# Patient Record
Sex: Female | Born: 1990 | Race: Black or African American | Hispanic: No | Smoking: Never smoker
Health system: Southern US, Community
[De-identification: ages and names within clinical notes are randomized; demographics above are authoritative.]

## PROBLEM LIST (undated history)

## (undated) ENCOUNTER — Ambulatory Visit: Admission: EM | Payer: 59 | Source: Home / Self Care

## (undated) DIAGNOSIS — L732 Hidradenitis suppurativa: Secondary | ICD-10-CM

## (undated) DIAGNOSIS — Z862 Personal history of diseases of the blood and blood-forming organs and certain disorders involving the immune mechanism: Secondary | ICD-10-CM

## (undated) HISTORY — DX: Hidradenitis suppurativa: L73.2

## (undated) HISTORY — DX: Personal history of diseases of the blood and blood-forming organs and certain disorders involving the immune mechanism: Z86.2

## (undated) HISTORY — PX: OTHER SURGICAL HISTORY: SHX169

## (undated) HISTORY — PX: TONSILLECTOMY: SUR1361

---
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
## 2008-06-12 ENCOUNTER — Emergency Department: Payer: Self-pay | Admitting: Emergency Medicine

## 2008-06-14 ENCOUNTER — Emergency Department: Payer: Self-pay | Admitting: Emergency Medicine

## 2008-07-31 ENCOUNTER — Emergency Department: Payer: Self-pay | Admitting: Emergency Medicine

## 2008-08-03 ENCOUNTER — Emergency Department: Payer: Self-pay | Admitting: Emergency Medicine

## 2008-11-11 ENCOUNTER — Emergency Department: Payer: Self-pay | Admitting: Internal Medicine

## 2009-06-17 ENCOUNTER — Inpatient Hospital Stay: Payer: Self-pay | Admitting: Obstetrics & Gynecology

## 2009-07-03 ENCOUNTER — Emergency Department: Payer: Self-pay | Admitting: Emergency Medicine

## 2009-07-27 ENCOUNTER — Emergency Department: Payer: Self-pay | Admitting: Emergency Medicine

## 2010-01-01 ENCOUNTER — Emergency Department: Payer: Self-pay | Admitting: Emergency Medicine

## 2010-03-26 ENCOUNTER — Emergency Department: Payer: Self-pay | Admitting: Emergency Medicine

## 2010-05-08 ENCOUNTER — Emergency Department: Payer: Self-pay | Admitting: Emergency Medicine

## 2010-06-02 ENCOUNTER — Emergency Department: Payer: Self-pay | Admitting: Unknown Physician Specialty

## 2010-10-03 ENCOUNTER — Emergency Department: Payer: Self-pay | Admitting: Emergency Medicine

## 2010-10-04 ENCOUNTER — Inpatient Hospital Stay: Payer: Self-pay | Admitting: Advanced Practice Midwife

## 2011-04-10 ENCOUNTER — Emergency Department: Payer: Self-pay | Admitting: Emergency Medicine

## 2011-04-12 ENCOUNTER — Emergency Department: Payer: Self-pay | Admitting: Emergency Medicine

## 2011-04-20 ENCOUNTER — Emergency Department: Payer: Self-pay | Admitting: Emergency Medicine

## 2011-12-07 ENCOUNTER — Emergency Department: Payer: Self-pay | Admitting: Emergency Medicine

## 2012-01-14 ENCOUNTER — Emergency Department: Payer: Self-pay | Admitting: *Deleted

## 2012-03-01 ENCOUNTER — Ambulatory Visit: Payer: Self-pay | Admitting: Internal Medicine

## 2012-03-19 ENCOUNTER — Emergency Department: Payer: Self-pay | Admitting: Emergency Medicine

## 2012-03-19 LAB — URINALYSIS, COMPLETE
Glucose,UR: NEGATIVE mg/dL (ref 0–75)
Nitrite: NEGATIVE
Ph: 6 (ref 4.5–8.0)
Specific Gravity: 1.029 (ref 1.003–1.030)

## 2012-03-19 LAB — COMPREHENSIVE METABOLIC PANEL
Alkaline Phosphatase: 67 U/L (ref 50–136)
BUN: 13 mg/dL (ref 7–18)
Bilirubin,Total: 0.2 mg/dL (ref 0.2–1.0)
Chloride: 105 mmol/L (ref 98–107)
Co2: 26 mmol/L (ref 21–32)
Creatinine: 0.74 mg/dL (ref 0.60–1.30)
EGFR (African American): >60
EGFR (Non-African Amer.): >60
Osmolality: 275 (ref 275–301)
Sodium: 138 mmol/L (ref 136–145)

## 2012-03-19 LAB — CSF CELL CT + PROT + GLU PANEL
CSF Tube #: 1
Glucose, CSF: 47 mg/dL (ref 40–75)
Monocytes/Macrophages: 12 %
Protein, CSF: 25 mg/dL (ref 15–45)
RBC (CSF): 30 /mm3

## 2012-03-19 LAB — CSF CELL COUNT WITH DIFFERENTIAL
Lymphocytes: 93 %
Monocytes/Macrophages: 7 %

## 2012-03-19 LAB — CBC WITH DIFFERENTIAL/PLATELET
Basophil %: 0.5 %
Eosinophil #: 0.2 10*3/uL (ref 0.0–0.7)
Eosinophil %: 3.9 %
HGB: 10.7 g/dL — ABNORMAL LOW (ref 12.0–16.0)
Lymphocyte %: 48.7 %
MCHC: 34.6 g/dL (ref 32.0–36.0)
Monocyte #: 0.4 x10 3/mm (ref 0.2–0.9)
Neutrophil %: 37 %
Platelet: 242 10*3/uL (ref 150–440)
RBC: 3.44 10*6/uL — ABNORMAL LOW (ref 3.80–5.20)

## 2012-03-22 LAB — CSF CULTURE W GRAM STAIN

## 2012-03-28 ENCOUNTER — Emergency Department: Payer: Self-pay | Admitting: Emergency Medicine

## 2012-06-18 ENCOUNTER — Emergency Department: Payer: Self-pay | Admitting: Emergency Medicine

## 2013-06-22 ENCOUNTER — Emergency Department: Payer: Self-pay | Admitting: Emergency Medicine

## 2013-08-04 ENCOUNTER — Emergency Department: Payer: Self-pay | Admitting: Emergency Medicine

## 2013-08-04 LAB — CBC WITH DIFFERENTIAL/PLATELET
BASOS ABS: 0 10*3/uL (ref 0.0–0.1)
Basophil %: 0.4 %
Eosinophil #: 0 10*3/uL (ref 0.0–0.7)
Eosinophil %: 0.8 %
HCT: 32 % — ABNORMAL LOW (ref 35.0–47.0)
HGB: 11.3 g/dL — ABNORMAL LOW (ref 12.0–16.0)
LYMPHS ABS: 0.7 10*3/uL — AB (ref 1.0–3.6)
Lymphocyte %: 12.3 %
MCH: 33.4 pg (ref 26.0–34.0)
MCHC: 35.2 g/dL (ref 32.0–36.0)
MCV: 95 fL (ref 80–100)
MONOS PCT: 8.2 %
Monocyte #: 0.5 x10 3/mm (ref 0.2–0.9)
Neutrophil #: 4.3 10*3/uL (ref 1.4–6.5)
Neutrophil %: 78.3 %
Platelet: 222 10*3/uL (ref 150–440)
RBC: 3.37 10*6/uL — ABNORMAL LOW (ref 3.80–5.20)
RDW: 14 % (ref 11.5–14.5)
WBC: 5.5 10*3/uL (ref 3.6–11.0)

## 2013-08-04 LAB — COMPREHENSIVE METABOLIC PANEL
ALK PHOS: 61 U/L
ANION GAP: 6 — AB (ref 7–16)
Albumin: 3.5 g/dL (ref 3.4–5.0)
BUN: 12 mg/dL (ref 7–18)
Bilirubin,Total: 0.3 mg/dL (ref 0.2–1.0)
CALCIUM: 8.9 mg/dL (ref 8.5–10.1)
CREATININE: 0.78 mg/dL (ref 0.60–1.30)
Chloride: 104 mmol/L (ref 98–107)
Co2: 23 mmol/L (ref 21–32)
EGFR (African American): >60
Glucose: 98 mg/dL (ref 65–99)
OSMOLALITY: 266 (ref 275–301)
Potassium: 3.7 mmol/L (ref 3.5–5.1)
SGOT(AST): 24 U/L (ref 15–37)
SGPT (ALT): 19 U/L (ref 12–78)
SODIUM: 133 mmol/L — AB (ref 136–145)
TOTAL PROTEIN: 8 g/dL (ref 6.4–8.2)

## 2013-08-04 LAB — RAPID INFLUENZA A&B ANTIGENS

## 2013-08-05 LAB — URINALYSIS, COMPLETE
Bilirubin,UR: NEGATIVE
Blood: NEGATIVE
GLUCOSE, UR: NEGATIVE mg/dL (ref 0–75)
Ketone: NEGATIVE
Nitrite: NEGATIVE
PROTEIN: NEGATIVE
Ph: 7 (ref 4.5–8.0)
RBC,UR: 2 /HPF (ref 0–5)
Specific Gravity: 1.016 (ref 1.003–1.030)
WBC UR: 10 /HPF (ref 0–5)

## 2013-08-06 LAB — URINE CULTURE

## 2013-08-09 LAB — CULTURE, BLOOD (SINGLE)

## 2014-05-15 ENCOUNTER — Emergency Department: Payer: Self-pay | Admitting: Emergency Medicine

## 2014-05-15 LAB — CBC
HCT: 33.3 % — ABNORMAL LOW (ref 35.0–47.0)
HGB: 10.9 g/dL — AB (ref 12.0–16.0)
MCH: 30.7 pg (ref 26.0–34.0)
MCHC: 32.8 g/dL (ref 32.0–36.0)
MCV: 93 fL (ref 80–100)
Platelet: 257 10*3/uL (ref 150–440)
RBC: 3.56 10*6/uL — ABNORMAL LOW (ref 3.80–5.20)
RDW: 13.6 % (ref 11.5–14.5)
WBC: 4.3 10*3/uL (ref 3.6–11.0)

## 2014-06-02 ENCOUNTER — Emergency Department: Payer: Self-pay | Admitting: Emergency Medicine

## 2014-06-07 ENCOUNTER — Emergency Department: Payer: Self-pay | Admitting: Emergency Medicine

## 2014-06-29 ENCOUNTER — Emergency Department: Payer: Self-pay | Admitting: Emergency Medicine

## 2014-07-03 LAB — WOUND CULTURE

## 2014-07-05 ENCOUNTER — Emergency Department: Payer: Self-pay | Admitting: Student

## 2014-08-25 ENCOUNTER — Emergency Department: Payer: Self-pay | Admitting: Emergency Medicine

## 2015-05-04 ENCOUNTER — Emergency Department
Admission: EM | Admit: 2015-05-04 | Discharge: 2015-05-04 | Disposition: A | Discharge status: Home / Self Care | Payer: Self-pay | Attending: Emergency Medicine | Admitting: Emergency Medicine

## 2015-05-04 DIAGNOSIS — X58XXXA Exposure to other specified factors, initial encounter: Secondary | ICD-10-CM | POA: Insufficient documentation

## 2015-05-04 DIAGNOSIS — Y998 Other external cause status: Secondary | ICD-10-CM | POA: Insufficient documentation

## 2015-05-04 DIAGNOSIS — S46912A Strain of unspecified muscle, fascia and tendon at shoulder and upper arm level, left arm, initial encounter: Secondary | ICD-10-CM | POA: Insufficient documentation

## 2015-05-04 DIAGNOSIS — Y93F2 Activity, caregiving, lifting: Secondary | ICD-10-CM | POA: Insufficient documentation

## 2015-05-04 DIAGNOSIS — Y9289 Other specified places as the place of occurrence of the external cause: Secondary | ICD-10-CM | POA: Insufficient documentation

## 2015-05-04 MED ORDER — DIAZEPAM 2 MG PO TABS: 2.0000 mg | ORAL_TABLET | Freq: Three times a day (TID) | ORAL | Status: DC | PRN

## 2015-05-04 MED ORDER — ETODOLAC 400 MG PO TABS: 400.0000 mg | ORAL_TABLET | Freq: Two times a day (BID) | ORAL | Status: DC

## 2015-05-04 NOTE — ED Notes (Signed)
States she developed pain to mid back yesterday  Denies any nausea /vomiting fever or injury. Pain increases with palpation and when taking a deep breath

## 2015-05-04 NOTE — Discharge Instructions (Signed)
Muscle Strain °A muscle strain (pulled muscle) happens when a muscle is stretched beyond normal length. It happens when a sudden, violent force stretches your muscle too far. Usually, a few of the fibers in your muscle are torn. Muscle strain is common in athletes. Recovery usually takes 1-2 weeks. Complete healing takes 5-6 weeks.  °HOME CARE  °· Follow the PRICE method of treatment to help your injury get better. Do this the first 2-3 days after the injury: °· Protect. Protect the muscle to keep it from getting injured again. °· Rest. Limit your activity and rest the injured body part. °· Ice. Put ice in a plastic bag. Place a towel between your skin and the bag. Then, apply the ice and leave it on from 15-20 minutes each hour. After the third day, switch to moist heat packs. °· Compression. Use a splint or elastic bandage on the injured area for comfort. Do not put it on too tightly. °· Elevate. Keep the injured body part above the level of your heart. °· Only take medicine as told by your doctor. °· Warm up before doing exercise to prevent future muscle strains. °GET HELP IF:  °· You have more pain or puffiness (swelling) in the injured area. °· You feel numbness, tingling, or notice a loss of strength in the injured area. °MAKE SURE YOU:  °· Understand these instructions. °· Will watch your condition. °· Will get help right away if you are not doing well or get worse. °  °This information is not intended to replace advice given to you by your health care provider. Make sure you discuss any questions you have with your health care provider. °  °Document Released: 04/15/2008 Document Revised: 04/27/2013 Document Reviewed: 02/03/2013 °Elsevier Interactive Patient Education ©2016 [employer] ° °Cryotherapy °Cryotherapy is when you put ice on your injury. Ice helps lessen pain and puffiness (swelling) after an injury. Ice works the best when you start using it in the first 24 to 48 hours after an injury. °HOME  CARE °· Put a dry or damp towel between the ice pack and your skin. °· You may press gently on the ice pack. °· Leave the ice on for no more than 10 to 20 minutes at a time. °· Check your skin after 5 minutes to make sure your skin is okay. °· Rest at least 20 minutes between ice pack uses. °· Stop using ice when your skin loses feeling (numbness). °· Do not use ice on someone who cannot tell you when it hurts. This includes small children and people with memory problems (dementia). °GET HELP RIGHT AWAY IF: °· You have white spots on your skin. °· Your skin turns blue or pale. °· Your skin feels waxy or hard. °· Your puffiness gets worse. °MAKE SURE YOU:  °· Understand these instructions. °· Will watch your condition. °· Will get help right away if you are not doing well or get worse. °  °This information is not intended to replace advice given to you by your health care provider. Make sure you discuss any questions you have with your health care provider. °  °Document Released: 12/24/2007 Document Revised: 09/29/2011 Document Reviewed: 02/27/2011 °Elsevier Interactive Patient Education ©2016 Elsevier Inc. ° °

## 2015-05-04 NOTE — ED Provider Notes (Signed)
[facility] Emergency Department Provider Note  ____________________________________________  Time seen: Approximately 9:13 AM  I have reviewed the triage vital signs and the nursing notes.   HISTORY  Chief Complaint Back Pain   HPI Kristina StarringCallie Michael Welch is a 24 y.o. female is here with complaint of back pain since yesterday. Patient states that she has been lifting her children but not doing anything that she recalls having an injury to her back. Range of motion reproduces pain. It is slightly decreased with rest.Patient denies any chest pain, diaphoresis or nausea. She has not taken any over-the-counter medication for her pain. Currently her pain is rated an 8 out of 10.   History reviewed. No pertinent past medical history.  There are no active problems to display for this patient.   Past Surgical History  Procedure Laterality Date  . Tonsillectomy    . Cesarean section    . Cyst removed      Current Outpatient Rx  Name  Route  Sig  Dispense  Refill  . diazepam (VALIUM) 2 MG tablet   Oral   Take 1 tablet (2 mg total) by mouth every 8 (eight) hours as needed for muscle spasms.   9 tablet   0   . etodolac (LODINE) 400 MG tablet   Oral   Take 1 tablet (400 mg total) by mouth 2 (two) times daily.   20 tablet   0     Allergies Peanut-containing drug products and Zyrtec  No family history on file.  Social History Social History  Substance Use Topics  . Smoking status: Never Smoker   . Smokeless tobacco: None  . Alcohol Use: No    Review of Systems Constitutional: No fever/chills Eyes: No visual changes. ENT: No sore throat. Cardiovascular: Denies chest pain. Respiratory: Denies shortness of breath. Gastrointestinal: No abdominal pain.  No nausea, no vomiting.  Genitourinary: Negative for dysuria. Musculoskeletal: Positive for back pain. Skin: Negative for rash. Neurological: Negative for headaches, focal weakness or  numbness.  10-point ROS otherwise negative.  ____________________________________________   PHYSICAL EXAM:  VITAL SIGNS: ED Triage Vitals  Enc Vitals Group     BP 05/04/15 0900 115/61 mmHg     Pulse Rate 05/04/15 0857 65     Resp 05/04/15 0857 17     Temp 05/04/15 0857 97.7 F (36.5 C)     Temp Source 05/04/15 0857 Oral     SpO2 05/04/15 0857 100 %     Weight 05/04/15 0857 200 lb (90.719 kg)     Height 05/04/15 0857 5\' 3"  (1.6 m)     Head Cir --      Peak Flow --      Pain Score 05/04/15 0858 8     Pain Loc --      Pain Edu? --      Excl. in GC? --     Constitutional: Alert and oriented. Well appearing and in no acute distress. Eyes: Conjunctivae are normal. PERRL. EOMI. Head: Atraumatic. Nose: No congestion/rhinnorhea. Neck: No stridor.  No cervical tenderness on palpation area and range of motion of the neck all planes without any restrictions. Cardiovascular: Normal rate, regular rhythm. Grossly normal heart sounds.  Good peripheral circulation. Respiratory: Normal respiratory effort.  No retractions. Lungs CTAB. Gastrointestinal: Soft and nontender. No distention. No CVA tenderness bilaterally Musculoskeletal: Examination of the upper back. She any gross abnormality. Range of motion is minimally restricted. There is no active muscle spasm seen. There is tenderness on  palpation of the parascapular muscles left side and pain is reproduced with exertion. No lower extremity tenderness nor edema.  No joint effusions. Neurologic:  Normal speech and language. No gross focal neurologic deficits are appreciated. No gait instability. Skin:  Skin is warm, dry and intact. No rash noted. Psychiatric: Mood and affect are normal. Speech and behavior are normal.  ____________________________________________   LABS (all labs ordered are listed, but only abnormal results are displayed)  Labs Reviewed - No data to display  PROCEDURES  Procedure(s) performed: None  Critical Care  performed: No  ____________________________________________   INITIAL IMPRESSION / ASSESSMENT AND PLAN / ED COURSE  Pertinent labs & imaging results that were available during my care of the patient were reviewed by me and considered in my medical decision making (see chart for details).  Patient was started on etodolac one twice a day with food. She is also given a prescription for diazepam 2 mg 1 3 times a day for muscle spasms. She is encouraged to use moist heat or ice to her back as needed. She is also follow-up if not improving in one week. ____________________________________________   FINAL CLINICAL IMPRESSION(S) / ED DIAGNOSES  Final diagnoses:  Muscle strain of left scapular region, initial encounter      Tommi Rumps, PA-C 05/04/15 1125  Sharyn Creamer, MD 05/04/15 1504

## 2015-05-04 NOTE — ED Notes (Signed)
Pt c/o upper back pain since yesterday, worse with movement.Marland Kitchen.denies injury

## 2015-05-06 ENCOUNTER — Emergency Department (HOSPITAL_COMMUNITY): Payer: Self-pay

## 2015-05-06 ENCOUNTER — Encounter (HOSPITAL_COMMUNITY): Payer: Self-pay | Admitting: Emergency Medicine

## 2015-05-06 ENCOUNTER — Emergency Department (HOSPITAL_COMMUNITY)
Admission: EM | Admit: 2015-05-06 | Discharge: 2015-05-06 | Disposition: A | Discharge status: Home / Self Care | Payer: Self-pay | Attending: Emergency Medicine | Admitting: Emergency Medicine

## 2015-05-06 DIAGNOSIS — J392 Other diseases of pharynx: Secondary | ICD-10-CM | POA: Insufficient documentation

## 2015-05-06 DIAGNOSIS — M546 Pain in thoracic spine: Secondary | ICD-10-CM | POA: Insufficient documentation

## 2015-05-06 DIAGNOSIS — R509 Fever, unspecified: Secondary | ICD-10-CM | POA: Insufficient documentation

## 2015-05-06 DIAGNOSIS — R059 Cough, unspecified: Secondary | ICD-10-CM

## 2015-05-06 DIAGNOSIS — Z791 Long term (current) use of non-steroidal anti-inflammatories (NSAID): Secondary | ICD-10-CM | POA: Insufficient documentation

## 2015-05-06 DIAGNOSIS — R05 Cough: Secondary | ICD-10-CM | POA: Insufficient documentation

## 2015-05-06 DIAGNOSIS — R0981 Nasal congestion: Secondary | ICD-10-CM | POA: Insufficient documentation

## 2015-05-06 MED ORDER — KETOROLAC TROMETHAMINE 60 MG/2ML IM SOLN: 60.0000 mg | Freq: Once | INTRAMUSCULAR | Status: DC

## 2015-05-06 MED ORDER — DIAZEPAM 5 MG PO TABS
5.0000 mg | ORAL_TABLET | Freq: Once | ORAL | Status: AC
  Administered 2015-05-06: 5 mg via ORAL
  Filled 2015-05-06: qty 1

## 2015-05-06 MED ORDER — IBUPROFEN 800 MG PO TABS
800.0000 mg | ORAL_TABLET | Freq: Once | ORAL | Status: AC
  Administered 2015-05-06: 800 mg via ORAL
  Filled 2015-05-06: qty 1

## 2015-05-06 MED ORDER — AZITHROMYCIN 250 MG PO TABS: ORAL_TABLET | ORAL | Status: DC

## 2015-05-06 NOTE — ED Notes (Signed)
Pt reports on 14th she woke up w/ "bad back pain".  She has developed a cough which is producing green flem, and her chest is now hurting her.  Pt reports no fever/n/v/d.

## 2015-05-06 NOTE — ED Provider Notes (Signed)
CSN: [id number]     Arrival date & time 05/06/15  0304 History   By signing my name below, I, Kristina Welch, attest that this documentation has been prepared under the direction and in the presence of No att. providers found. Electronically Signed: Evon Welch, ED Scribe. 05/06/2015. 7:18 AM.      Chief Complaint  Patient presents with  . Back Pain   Patient is a 24 y.o. female presenting with back pain. The history is provided by the patient. No language interpreter was used.  Back Pain Location:  Generalized Quality:  Aching Radiates to:  Does not radiate Pain severity:  Mild Onset quality:  Gradual Duration:  4 days Timing:  Constant Progression:  Worsening Chronicity:  New Relieved by:  None tried Worsened by:  Nothing tried Ineffective treatments:  None tried Associated symptoms: fever   Associated symptoms: no abdominal pain and no chest pain    HPI Comments: Kristina Welch is a 24 y.o. female who presents to the Emergency Department complaining of constant shooting -throbbing back pain onset 2 days prior. Pt states that the pain is worse with breathing and coughing. Pt states she has tried icy hot with no relief. Pt states she also has a productive cough of yellow sputum that began today. Pt denies fever, sore throat or n/v/d. Pt denies any recent travel. Pt denies Hx of thrombus.    History reviewed. No pertinent past medical history. Past Surgical History  Procedure Laterality Date  . Tonsillectomy    . Cesarean section    . Cyst removed     No family history on file. Social History  Substance Use Topics  . Smoking status: Never Smoker   . Smokeless tobacco: None  . Alcohol Use: No   OB History    No data available     Review of Systems  Constitutional: Positive for fever. Negative for chills.  HENT: Negative for sore throat.   Eyes: Negative for pain.  Respiratory: Positive for cough.   Cardiovascular: Negative for chest pain.   Gastrointestinal: Negative for nausea, vomiting, abdominal pain and diarrhea.  Musculoskeletal: Positive for back pain (pleuritic).  All other systems reviewed and are negative.    Allergies  Peanut-containing drug products and Zyrtec  Home Medications   Prior to Admission medications   Medication Sig Start Date End Date Taking? Authorizing Provider  azithromycin (ZITHROMAX Z-PAK) 250 MG tablet 2 po day one, then 1 daily x 4 days 05/06/15   Marily Memos, MD  diazepam (VALIUM) 2 MG tablet Take 1 tablet (2 mg total) by mouth every 8 (eight) hours as needed for muscle spasms. 05/04/15   Tommi Rumps, PA-C  etodolac (LODINE) 400 MG tablet Take 1 tablet (400 mg total) by mouth 2 (two) times daily. 05/04/15   Tommi Rumps, PA-C   BP 110/72 mmHg  Pulse 67  Temp(Src) 98.1 F (36.7 C) (Oral)  Resp 18  Ht  (1.6 m)  Wt 197 lb (89.359 kg)  BMI 34.91 kg/m2  SpO2 100%  LMP 04/29/2015   Physical Exam  Constitutional: She is oriented to person, place, and time. She appears well-developed and well-nourished. No distress.  HENT:  Head: Normocephalic and atraumatic.  Nose: Mucosal edema present. No rhinorrhea. Right sinus exhibits no maxillary sinus tenderness and no frontal sinus tenderness. Left sinus exhibits no maxillary sinus tenderness and no frontal sinus tenderness.  Mouth/Throat: Posterior oropharyngeal erythema present.  Eyes: Conjunctivae and EOM are normal.  Neck: Neck  supple. No tracheal deviation present.  Cardiovascular: Normal rate, regular rhythm and normal heart sounds.   Pulmonary/Chest: Effort normal and breath sounds normal. No respiratory distress. She has no wheezes. She has no rales.  Musculoskeletal: Normal range of motion.  No midline spinal tenderness.   Lymphadenopathy:    She has no cervical adenopathy.  Neurological: She is alert and oriented to person, place, and time.  Skin: Skin is warm and dry.  Psychiatric: She has a normal mood and affect.  Her behavior is normal.  Nursing note and vitals reviewed.   ED Course  Procedures (including critical care time) DIAGNOSTIC STUDIES: Oxygen Saturation is 100% on RA, normal by my interpretation.    COORDINATION OF CARE: 3:48 AM-Discussed treatment plan with pt at bedside and pt agreed to plan.     Labs Review Labs Reviewed - No data to display  Imaging Review Dg Chest 2 View  05/06/2015  CLINICAL DATA:  Upper back pain for 2 days. Shortness of breath. Cough. Congestion. Nonsmoker. EXAM: CHEST  2 VIEW COMPARISON:  None. FINDINGS: Normal heart size and pulmonary vascularity. No focal airspace disease or consolidation in the lungs. No blunting of costophrenic angles. No pneumothorax. Mediastinal contours appear intact. Mild convexity of the thoracic spine towards the right. IMPRESSION: No active cardiopulmonary disease. Electronically Signed   By: Burman NievesWilliam  Stevens M.D.   On: 05/06/2015 04:23      EKG Interpretation None      MDM   Final diagnoses:  Cough  Bilateral thoracic back pain   24 year old female here with likely muscular skeletal back pain. However has had cough with production and also subjective fevers and chills along with a pleuritic component of her back pain so we'll treat for possible infection. She'll get her prescription filled for muscle relaxers and nonsteroidal which likely help more with her symptoms. She is PERC negative and low risk for ACS so will not work those up further.   I have personally and contemperaneously reviewed labs and imaging and used in my decision making as above.   A medical screening exam was performed and I feel the patient has had an appropriate workup for their chief complaint at this time and likelihood of emergent condition existing is low. They have been counseled on decision, discharge, follow up and which symptoms necessitate immediate return to the emergency department. They or their family verbally stated understanding and  agreement with plan and discharged in stable condition.    I personally performed the services described in this documentation, which was scribed in my presence. The recorded information has been reviewed and is accurate.      Marily MemosJason Jeiry Birnbaum, MD 05/06/15 [phone]

## 2015-06-22 ENCOUNTER — Encounter: Payer: Self-pay | Admitting: *Deleted

## 2015-06-22 ENCOUNTER — Emergency Department
Admission: EM | Admit: 2015-06-22 | Discharge: 2015-06-22 | Discharge status: Against Medical Advice | Payer: Self-pay | Attending: Emergency Medicine | Admitting: Emergency Medicine

## 2015-06-22 DIAGNOSIS — R42 Dizziness and giddiness: Secondary | ICD-10-CM | POA: Insufficient documentation

## 2015-06-22 NOTE — ED Notes (Addendum)
States developed dizziness , started her menses today, states dizziness is gone but has a lot of pressure around eyes

## 2015-06-22 NOTE — ED Notes (Signed)
Called for room    No answer in lobby 

## 2015-06-25 ENCOUNTER — Telehealth: Payer: Self-pay | Admitting: Emergency Medicine

## 2015-06-25 NOTE — ED Notes (Signed)
Called patient due to lwot to inquire about condition and follow up plans. Left message with my number. 

## 2015-07-24 ENCOUNTER — Encounter: Payer: Self-pay | Admitting: Family Medicine

## 2015-07-30 ENCOUNTER — Encounter: Payer: Self-pay | Admitting: Family Medicine

## 2015-08-01 ENCOUNTER — Encounter: Payer: Self-pay | Admitting: Family Medicine

## 2015-08-03 ENCOUNTER — Encounter: Payer: Self-pay | Admitting: Family Medicine

## 2015-08-29 ENCOUNTER — Encounter: Payer: Self-pay | Admitting: Family Medicine

## 2015-08-29 ENCOUNTER — Ambulatory Visit: Payer: Self-pay | Admitting: Family Medicine

## 2015-08-29 ENCOUNTER — Ambulatory Visit (INDEPENDENT_AMBULATORY_CARE_PROVIDER_SITE_OTHER): Payer: Self-pay | Admitting: Family Medicine

## 2015-08-29 VITALS — BP 124/64 | HR 76 | Temp 98.0°F | Resp 16 | Ht 63.5 in | Wt 212.6 lb

## 2015-08-29 DIAGNOSIS — L739 Follicular disorder, unspecified: Secondary | ICD-10-CM | POA: Insufficient documentation

## 2015-08-29 DIAGNOSIS — L732 Hidradenitis suppurativa: Secondary | ICD-10-CM | POA: Insufficient documentation

## 2015-08-29 DIAGNOSIS — N938 Other specified abnormal uterine and vaginal bleeding: Secondary | ICD-10-CM

## 2015-08-29 DIAGNOSIS — Z30018 Encounter for initial prescription of other contraceptives: Secondary | ICD-10-CM

## 2015-08-29 DIAGNOSIS — Z862 Personal history of diseases of the blood and blood-forming organs and certain disorders involving the immune mechanism: Secondary | ICD-10-CM

## 2015-08-29 LAB — POCT URINE PREGNANCY: PREG TEST UR: NEGATIVE

## 2015-08-29 NOTE — Patient Instructions (Signed)
We will call you with the lab results. 

## 2015-08-29 NOTE — Progress Notes (Signed)
Subjective:     Patient ID: Kristina Welch, female   DOB: 14-Oct-1990, 25 y.o.   MRN: [id number]  HPI  Chief Complaint  Patient presents with  . Contraception    Patient comes in office today to disucss contraception, patient states that she is interested in getting an implant. Patient does not have a regular Gyn that she sees and states that she does not know when her last pap smear exam was. Patient reports having a history of anemia, she has been having two menstrual cycles a month. In January patient had cyle that began on 07/23/15 and again on 08/20/15, patient describes menstrual cycle as heavy bleeding she states that she usually experinces dizziness &nausea  Has hx of DUB but did not follow through with pelvic U/S in 2015. She is a G-2 P-2 A-0 with last menses 1/30-2/5. Does not wish any further children. Hx of iron deficiency anemia.   Review of Systems     Objective:   Physical Exam  Constitutional: She appears well-developed and well-nourished. No distress.       Assessment:    1. Encounter for initial prescription of other contraceptives - POCT urine pregnancy  2. Dysfunctional uterine bleeding - Ambulatory referral to Gynecology  3. H/O iron deficiency anemia - CBC with Differential/Platelet - Ferritin    Plan:    Further f/u pending lab results.

## 2015-08-30 ENCOUNTER — Telehealth: Payer: Self-pay | Admitting: Family Medicine

## 2015-08-30 ENCOUNTER — Telehealth: Payer: Self-pay

## 2015-08-30 LAB — CBC WITH DIFFERENTIAL/PLATELET
BASOS ABS: 0 10*3/uL (ref 0.0–0.2)
Basos: 1 %
EOS (ABSOLUTE): 0.2 10*3/uL (ref 0.0–0.4)
Eos: 6 %
Hematocrit: 32 % — ABNORMAL LOW (ref 34.0–46.6)
Hemoglobin: 10.5 g/dL — ABNORMAL LOW (ref 11.1–15.9)
Immature Grans (Abs): 0 10*3/uL (ref 0.0–0.1)
Immature Granulocytes: 0 %
LYMPHS ABS: 2 10*3/uL (ref 0.7–3.1)
LYMPHS: 50 %
MCH: 29.6 pg (ref 26.6–33.0)
MCHC: 32.8 g/dL (ref 31.5–35.7)
MCV: 90 fL (ref 79–97)
MONOCYTES: 10 %
Monocytes Absolute: 0.4 10*3/uL (ref 0.1–0.9)
NEUTROS ABS: 1.3 10*3/uL — AB (ref 1.4–7.0)
Neutrophils: 33 %
PLATELETS: 302 10*3/uL (ref 150–379)
RBC: 3.55 x10E6/uL — AB (ref 3.77–5.28)
RDW: 14.7 % (ref 12.3–15.4)
WBC: 3.9 10*3/uL (ref 3.4–10.8)

## 2015-08-30 LAB — FERRITIN: FERRITIN: 19 ng/mL (ref 15–150)

## 2015-08-30 NOTE — Telephone Encounter (Signed)
error 

## 2015-08-30 NOTE — Telephone Encounter (Signed)
Pt advised-aa 

## 2015-08-30 NOTE — Telephone Encounter (Signed)
-----   Message from Anola Gurney, [place] sent at 08/30/2015  7:51 AM EST ----- Mild anemia with borderline normal iron stores. May wish to go back on iron sulfate 300-325 mg.twice daily for 6 weeks then recheck labs. Once your menses are controlled this may not be an issue anymore.

## 2015-08-30 NOTE — Telephone Encounter (Signed)
Pt wants to know if her test results are back yet.  Her call back is [phone]  Thanks teri

## 2015-08-30 NOTE — Telephone Encounter (Signed)
LMTCB

## 2015-08-31 ENCOUNTER — Ambulatory Visit (INDEPENDENT_AMBULATORY_CARE_PROVIDER_SITE_OTHER): Payer: Self-pay | Admitting: Family Medicine

## 2015-08-31 ENCOUNTER — Encounter: Payer: Self-pay | Admitting: Family Medicine

## 2015-08-31 VITALS — BP 110/62 | HR 96 | Temp 97.9°F | Resp 16 | Wt 209.6 lb

## 2015-08-31 DIAGNOSIS — G932 Benign intracranial hypertension: Secondary | ICD-10-CM

## 2015-08-31 NOTE — Patient Instructions (Signed)
We will call you with the time of referral.

## 2015-08-31 NOTE — Progress Notes (Signed)
Subjective:     Patient ID: Kristina Welch, female   DOB: April 23, 1991, 25 y.o.   MRN: [id number]  HPI  Chief Complaint  Patient presents with  . Follow-up    Patient comes in office today for follow up from her opthamolic exam, patient states that she was informed that had inflamatriion behind her eye and drainage . Patient states that doctor believed symptoms weree due to idiopathic intracranial hypertension.   States she is asymptomatic without headache or visual changes. Wishes second opinion.   Review of Systems     Objective:   Physical Exam  Constitutional: She appears well-developed and well-nourished. No distress.  Psychiatric:  Anxious about diagnosis       Assessment:    1. Pseudotumor cerebri: Will get second opinion re: diagnosis - Ambulatory referral to Ophthalmology    Plan:    Referral to neurology if diagnosis confirmed.

## 2015-09-11 ENCOUNTER — Telehealth: Payer: Self-pay | Admitting: Family Medicine

## 2015-09-11 NOTE — Telephone Encounter (Signed)
Pt states she started taking iron pills a week ago and she has had diarrhea since she started the iron pills.  Pt is asking if this is normal?  CB#[phone]/MW

## 2015-09-11 NOTE — Telephone Encounter (Signed)
Patient was advised to stop iron till diarrhea cleared and then she was instructed to restart to one pill daily or every other day. Patient states along with diarrhea she has been belching a lot and she reports it smells like rotten eggs, patient wants to now if there is something she can take to stop belching? KW

## 2015-09-11 NOTE — Telephone Encounter (Signed)
Patient was advised as below, instructed patient to call back at end of week to give an update on how she is doing. KW

## 2015-09-11 NOTE — Telephone Encounter (Signed)
The only antigas medication is simethicone which is in over the counter products like Mylicon or Gas-X. It is also possible she has a stomach bug and it is not the iron causing her discomforts.

## 2015-09-13 ENCOUNTER — Telehealth: Payer: Self-pay | Admitting: Family Medicine

## 2015-09-13 NOTE — Telephone Encounter (Signed)
Pt called in needing to give a report on how she is feeling since she went off hte iron pills.  Her call back is [phone]  Thanks, Barth Kirks

## 2015-09-14 NOTE — Telephone Encounter (Signed)
Advised patient as below.  

## 2015-09-14 NOTE — Telephone Encounter (Signed)
Patient reports that she thinks that she did have the stomach bug last week when she started the iron pills. Patient reports that she is feeling much better, still not 100%. She was wanting to know should she go back to taking iron pills once a day, or every other day that you mentioned being that she is still not completely better? Please advise. Thanks!

## 2015-09-14 NOTE — Telephone Encounter (Signed)
What until you are eating normally the resume at every other day.

## 2015-09-19 ENCOUNTER — Telehealth: Payer: Self-pay | Admitting: Family Medicine

## 2015-09-19 NOTE — Telephone Encounter (Signed)
Pt wants to know if she can go back to taking her iron pills and if so what dose should she be taking  Call back [phone]  Thanks  Barth Kirks

## 2015-09-20 NOTE — Telephone Encounter (Signed)
Please advise 

## 2015-09-20 NOTE — Telephone Encounter (Signed)
Unable to reach patient at this time, voicemail box has not been set up yet. Will try contacting patient again later this afternoon. KW

## 2015-09-20 NOTE — Telephone Encounter (Signed)
Start at one a day of the iron sulfate. If you are tolerating it ok after a week go up to twice daily.

## 2015-09-21 ENCOUNTER — Encounter: Payer: Self-pay | Admitting: Family Medicine

## 2015-09-21 ENCOUNTER — Ambulatory Visit (INDEPENDENT_AMBULATORY_CARE_PROVIDER_SITE_OTHER): Payer: Self-pay | Admitting: Family Medicine

## 2015-09-21 VITALS — BP 110/82 | HR 78 | Temp 98.2°F | Resp 16 | Wt 213.8 lb

## 2015-09-21 DIAGNOSIS — N644 Mastodynia: Secondary | ICD-10-CM

## 2015-09-21 NOTE — Progress Notes (Signed)
Subjective:     Patient ID: Kristina Welch, female   DOB: 08/08/1990, 25 y.o.   MRN: [id number]  HPI  Chief Complaint  Patient presents with  . Breast Problem    Patient comes in office today with cocnerns of pain around both her nipples. Patient describes pain as burning and has been intermittent, patient denies any nipple discharge or discoloration/cracking of nipple. Patient reports last nigth after taking hot shower pain seemed to subside but returned back again today.   States burning has subsided. Denies any scraping from clothing but has bothered her enough she has not worn a bra today. Generally will sleep on her chest.Has taken 3 negative pregnancy tests in the last 24 hours. She is still pending gyn evaluation of dysfunctional uterine bleeding.   Review of Systems     Objective:   Physical Exam  Constitutional: She appears well-developed and well-nourished. No distress.  Nipples without tenderness/discharge/abrasion No axillary adenopathy but has scarring from hidradenitis which limits exam     Assessment:    1. Nipple pain - hCG, serum, qualitative    Plan:    Further f/u pending lab result.

## 2015-09-21 NOTE — Telephone Encounter (Signed)
Patient has been notified at todays office visit. KW

## 2015-09-21 NOTE — Patient Instructions (Signed)
We will call you with the lab results. 

## 2015-10-02 ENCOUNTER — Encounter: Payer: Self-pay | Admitting: Emergency Medicine

## 2015-10-02 ENCOUNTER — Emergency Department
Admission: EM | Admit: 2015-10-02 | Discharge: 2015-10-02 | Disposition: A | Discharge status: Home / Self Care | Payer: Self-pay | Attending: Emergency Medicine | Admitting: Emergency Medicine

## 2015-10-02 DIAGNOSIS — H9209 Otalgia, unspecified ear: Secondary | ICD-10-CM | POA: Insufficient documentation

## 2015-10-02 DIAGNOSIS — J069 Acute upper respiratory infection, unspecified: Secondary | ICD-10-CM | POA: Insufficient documentation

## 2015-10-02 DIAGNOSIS — Z79899 Other long term (current) drug therapy: Secondary | ICD-10-CM | POA: Insufficient documentation

## 2015-10-02 MED ORDER — PSEUDOEPH-BROMPHEN-DM 30-2-10 MG/5ML PO SYRP: 10.0000 mL | ORAL_SOLUTION | Freq: Four times a day (QID) | ORAL | Status: DC | PRN

## 2015-10-02 MED ORDER — FLUTICASONE PROPIONATE 50 MCG/ACT NA SUSP: 2.0000 | Freq: Every day | NASAL | Status: DC

## 2015-10-02 NOTE — ED Provider Notes (Signed)
[facility] Emergency Department Provider Note  ____________________________________________  Time seen: Approximately 6:26 PM  I have reviewed the triage vital signs and the nursing notes.   HISTORY  Chief Complaint Sore Throat; Cough; and Otalgia    HPI Kristina Welch is a 25 y.o. female , NAD, presents to the emergency department with nasal congestion, sneezing, cough, left ear pressure and sore throat since Friday. Has not had any fevers, chills, body aches. Has tried over-the-counter medications without significant relief. States she feels like there is fluid in her ear. Has had no abdominal pain, nausea, vomiting, diarrhea. No known sick contacts.   Past Medical History  Diagnosis Date  . History of iron deficiency anemia     Patient Active Problem List   Diagnosis Date Noted  . DUB (dysfunctional uterine bleeding) 08/29/2015  . H/O iron deficiency anemia 08/29/2015  . Hidradenitis 08/29/2015    Past Surgical History  Procedure Laterality Date  . Tonsillectomy    . Cesarean section    . Cyst removed      Current Outpatient Rx  Name  Route  Sig  Dispense  Refill  . brompheniramine-pseudoephedrine-DM 30-2-10 MG/5ML syrup   Oral   Take 10 mLs by mouth 4 (four) times daily as needed.   200 mL   0   . ferrous sulfate 325 (65 FE) MG tablet   Oral   Take 325 mg by mouth daily with breakfast.         . fluticasone (FLONASE) 50 MCG/ACT nasal spray   Each Nare   Place 2 sprays into both nostrils daily.   16 g   0     Allergies Peanut-containing drug products and Zyrtec  Family History  Problem Relation Age of Onset  . Cancer Mother     remission  . Hypertension Father   . Healthy Daughter   . Healthy Son   . Cancer Maternal Grandmother     breast    Social History Social History  Substance Use Topics  . Smoking status: Never Smoker   . Smokeless tobacco: None  . Alcohol Use: No     Review of Systems   Constitutional: No fever/chills Eyes: No visual changes. No discharge ENT: Positive sneezing, nasal congestion, ear pain, sore throat. No sinus pressure. Cardiovascular: No chest pain. Respiratory: Positive cough. No shortness of breath. No wheezing.  Gastrointestinal: No abdominal pain.  No nausea, vomiting.   Musculoskeletal: Negative for general myalgias.  Skin: Negative for rash. Neurological: Negative for headaches, focal weakness or numbness. 10-point ROS otherwise negative.  ____________________________________________   PHYSICAL EXAM:  VITAL SIGNS: ED Triage Vitals  Enc Vitals Group     BP 10/02/15 1806 116/88 mmHg     Pulse Rate 10/02/15 1806 88     Resp 10/02/15 1806 18     Temp 10/02/15 1806 98.2 F (36.8 C)     Temp Source 10/02/15 1806 Oral     SpO2 10/02/15 1806 100 %     Weight --      Height 10/02/15 1806 5\' 3"  (1.6 m)     Head Cir --      Peak Flow --      Pain Score 10/02/15 1806 8     Pain Loc --      Pain Edu? --      Excl. in GC? --     Constitutional: Alert and oriented. Well appearing and in no acute distress. Eyes: Conjunctivae are normal. PERRL. EOMI  without pain.  Head: Atraumatic. ENT:      Ears: TMs visualized bilaterally with trace serous effusion but no bulging, erythema, perforation.      Nose: No congestion/rhinnorhea.      Mouth/Throat: Mucous membranes are moist. Moderate clear postnasal drip. Pharynx without erythema, swelling, exudate. Neck: Supple with full range of motion Hematological/Lymphatic/Immunilogical: No cervical lymphadenopathy. Cardiovascular: Normal rate, regular rhythm. Normal S1 and S2.  Good peripheral circulation. Respiratory: Normal respiratory effort without tachypnea or retractions. Lungs CTAB. Neurologic:  Normal speech and language. No gross focal neurologic deficits are appreciated.  Skin:  Skin is warm, dry and intact. No rash noted. Psychiatric: Mood and affect are normal. Speech and behavior are normal.  Patient exhibits appropriate insight and judgement.   ____________________________________________   LABS  None  ____________________________________________  EKG  None ____________________________________________  RADIOLOGY  None ____________________________________________    PROCEDURES  Procedure(s) performed: None    Medications - No data to display   ____________________________________________   INITIAL IMPRESSION / ASSESSMENT AND PLAN / ED COURSE  Patient's diagnosis is consistent with viral upper respiratory infection. Patient will be discharged home with prescriptions for Bromfed-DM and Flonase to use as directed. Patient is to follow up with primary care provider or [facility] if symptoms persist past this treatment course. Patient is given ED precautions to return to the ED for any worsening or new symptoms.    ____________________________________________  FINAL CLINICAL IMPRESSION(S) / ED DIAGNOSES  Final diagnoses:  Viral upper respiratory infection      NEW MEDICATIONS STARTED DURING THIS VISIT:  New Prescriptions   BROMPHENIRAMINE-PSEUDOEPHEDRINE-DM 30-2-10 MG/5ML SYRUP    Take 10 mLs by mouth 4 (four) times daily as needed.   FLUTICASONE (FLONASE) 50 MCG/ACT NASAL SPRAY    Place 2 sprays into both nostrils daily.         Hope Pigeon, PA-C 10/02/15 1830  Minna Antis, MD 10/02/15 1928

## 2015-10-02 NOTE — Discharge Instructions (Signed)
Upper Respiratory Infection, Adult Most upper respiratory infections (URIs) are a viral infection of the air passages leading to the lungs. A URI affects the nose, throat, and upper air passages. The most common type of URI is nasopharyngitis and is typically referred to as "the common cold." URIs run their course and usually go away on their own. Most of the time, a URI does not require medical attention, but sometimes a bacterial infection in the upper airways can follow a viral infection. This is called a secondary infection. Sinus and middle ear infections are common types of secondary upper respiratory infections. Bacterial pneumonia can also complicate a URI. A URI can worsen asthma and chronic obstructive pulmonary disease (COPD). Sometimes, these complications can require emergency medical care and may be life threatening.  CAUSES Almost all URIs are caused by viruses. A virus is a type of germ and can spread from one person to another.  RISKS FACTORS You may be at risk for a URI if:   You smoke.   You have chronic heart or lung disease.  You have a weakened defense (immune) system.   You are very young or very old.   You have nasal allergies or asthma.  You work in crowded or poorly ventilated areas.  You work in health care facilities or schools. SIGNS AND SYMPTOMS  Symptoms typically develop 2-3 days after you come in contact with a cold virus. Most viral URIs last 7-10 days. However, viral URIs from the influenza virus (flu virus) can last 14-18 days and are typically more severe. Symptoms may include:   Runny or stuffy (congested) nose.   Sneezing.   Cough.   Sore throat.   Headache.   Fatigue.   Fever.   Loss of appetite.   Pain in your forehead, behind your eyes, and over your cheekbones (sinus pain).  Muscle aches.  DIAGNOSIS  Your health care provider may diagnose a URI by:  Physical exam.  Tests to check that your symptoms are not due to  another condition such as:  Strep throat.  Sinusitis.  Pneumonia.  Asthma. TREATMENT  A URI goes away on its own with time. It cannot be cured with medicines, but medicines may be prescribed or recommended to relieve symptoms. Medicines may help:  Reduce your fever.  Reduce your cough.  Relieve nasal congestion. HOME CARE INSTRUCTIONS   Take medicines only as directed by your health care provider.   Gargle warm saltwater or take cough drops to comfort your throat as directed by your health care provider.  Use a warm mist humidifier or inhale steam from a shower to increase air moisture. This may make it easier to breathe.  Drink enough fluid to keep your urine clear or pale yellow.   Eat soups and other clear broths and maintain good nutrition.   Rest as needed.   Return to work when your temperature has returned to normal or as your health care provider advises. You may need to stay home longer to avoid infecting others. You can also use a face mask and careful hand washing to prevent spread of the virus.  Increase the usage of your inhaler if you have asthma.   Do not use any tobacco products, including cigarettes, chewing tobacco, or electronic cigarettes. If you need help quitting, ask your health care provider. PREVENTION  The best way to protect yourself from getting a cold is to practice good hygiene.   Avoid oral or hand contact with people with cold   symptoms.   Wash your hands often if contact occurs.  There is no clear evidence that vitamin C, vitamin E, echinacea, or exercise reduces the chance of developing a cold. However, it is always recommended to get plenty of rest, exercise, and practice good nutrition.  SEEK MEDICAL CARE IF:   You are getting worse rather than better.   Your symptoms are not controlled by medicine.   You have chills.  You have worsening shortness of breath.  You have brown or red mucus.  You have yellow or brown nasal  discharge.  You have pain in your face, especially when you bend forward.  You have a fever.  You have swollen neck glands.  You have pain while swallowing.  You have white areas in the back of your throat. SEEK IMMEDIATE MEDICAL CARE IF:   You have severe or persistent:  Headache.  Ear pain.  Sinus pain.  Chest pain.  You have chronic lung disease and any of the following:  Wheezing.  Prolonged cough.  Coughing up blood.  A change in your usual mucus.  You have a stiff neck.  You have changes in your:  Vision.  Hearing.  Thinking.  Mood. MAKE SURE YOU:   Understand these instructions.  Will watch your condition.  Will get help right away if you are not doing well or get worse.   This information is not intended to replace advice given to you by your health care provider. Make sure you discuss any questions you have with your health care provider.   Document Released: 12/31/2000 Document Revised: 11/21/2014 Document Reviewed: 10/12/2013 Elsevier Interactive Patient Education 2016 Elsevier Inc.  

## 2015-10-02 NOTE — ED Notes (Signed)
Pt in via triage; pt reports cough, sneezing beginning on Friday.  Pt reports symptoms have worsened since then with complaints of a sore throat, fullness in ears.  Pt A/Ox4, no immediate distress at this time.

## 2015-10-02 NOTE — ED Notes (Signed)
Patient to ER for c/o sore throat, earache, and cough. Patient in no acute distress.

## 2015-10-03 ENCOUNTER — Other Ambulatory Visit: Payer: Self-pay | Admitting: Family Medicine

## 2015-10-03 ENCOUNTER — Telehealth: Payer: Self-pay

## 2015-10-03 DIAGNOSIS — L732 Hidradenitis suppurativa: Secondary | ICD-10-CM

## 2015-10-03 MED ORDER — DOXYCYCLINE HYCLATE 100 MG PO TABS: 100.0000 mg | ORAL_TABLET | Freq: Two times a day (BID) | ORAL | Status: DC

## 2015-10-03 NOTE — Telephone Encounter (Signed)
Patient has been advised. KW 

## 2015-10-03 NOTE — Telephone Encounter (Signed)
Patient called office requesting that a presciption be called into [facility] on [address] [place]. Patient reports that she has a history of hidradenitis and she states she has a bad flare up under both of her arms. Patient states that you have prescribed doxycycline before in the past, patient wants to now if you can send in Rx or if there is anything she can use over counter to help with irritation? Please advise. KW  CB- [phone]

## 2015-10-03 NOTE — Telephone Encounter (Signed)
I have sent in doxy for two weeks with a refill. This will not help her URI which she went to the ER for yesterday.

## 2015-10-09 ENCOUNTER — Ambulatory Visit: Payer: Self-pay | Admitting: Family Medicine

## 2015-10-25 ENCOUNTER — Encounter: Payer: Self-pay | Admitting: Obstetrics and Gynecology

## 2015-10-30 ENCOUNTER — Encounter: Payer: Self-pay | Admitting: Emergency Medicine

## 2015-10-30 ENCOUNTER — Emergency Department
Admission: EM | Admit: 2015-10-30 | Discharge: 2015-10-30 | Disposition: A | Discharge status: Home / Self Care | Payer: Self-pay | Attending: Emergency Medicine | Admitting: Emergency Medicine

## 2015-10-30 DIAGNOSIS — L509 Urticaria, unspecified: Secondary | ICD-10-CM | POA: Insufficient documentation

## 2015-10-30 DIAGNOSIS — T7840XA Allergy, unspecified, initial encounter: Secondary | ICD-10-CM | POA: Insufficient documentation

## 2015-10-30 MED ORDER — FAMOTIDINE 20 MG PO TABS
20.0000 mg | ORAL_TABLET | Freq: Once | ORAL | Status: AC
  Administered 2015-10-30: 20 mg via ORAL
  Filled 2015-10-30: qty 1

## 2015-10-30 MED ORDER — PREDNISONE 20 MG PO TABS
60.0000 mg | ORAL_TABLET | Freq: Once | ORAL | Status: AC
  Administered 2015-10-30: 60 mg via ORAL
  Filled 2015-10-30: qty 3

## 2015-10-30 MED ORDER — HYDROXYZINE HCL 25 MG PO TABS
50.0000 mg | ORAL_TABLET | Freq: Once | ORAL | Status: AC
  Administered 2015-10-30: 50 mg via ORAL
  Filled 2015-10-30: qty 2

## 2015-10-30 MED ORDER — HYDROXYZINE HCL 25 MG PO TABS: 25.0000 mg | ORAL_TABLET | ORAL | Status: DC | PRN

## 2015-10-30 MED ORDER — PREDNISONE 20 MG PO TABS: ORAL_TABLET | ORAL | Status: DC

## 2015-10-30 MED ORDER — PREDNISONE 20 MG PO TABS
ORAL_TABLET | ORAL | Status: AC
  Filled 2015-10-30: qty 2

## 2015-10-30 MED ORDER — FAMOTIDINE 20 MG PO TABS: 20.0000 mg | ORAL_TABLET | Freq: Two times a day (BID) | ORAL | Status: DC

## 2015-10-30 NOTE — ED Provider Notes (Signed)
[facility] Emergency Department Provider Note  ____________________________________________  Time seen: Approximately 4:47 AM  I have reviewed the triage vital signs and the nursing notes.   HISTORY  Chief Complaint Allergic Reaction    HPI Kristina Welch is a 25 y.o. female who presents to the ED from home with a chief complaint of hives. Patient reports waking up at approximately 3 AM with the palms of her hands itching. Discovered hives to all extremities and trunk. Denies tongue swelling, scratchy throat, difficulty breathing, wheezing, chest pain. Denies new exposures; specifically denies antibiotic use, over-the-counter medicines such as ibuprofen/aspirin, new lotions, detergents, toothpaste. Reports similar symptoms 2 weeks ago. Cannot identify eating the same foods. Patient is confident she was not in contact with peanuts. Denies recent travel or trauma. Took 2 Benadryl prior to arrival with improvement in hives.    Past Medical History  Diagnosis Date  . History of iron deficiency anemia     Patient Active Problem List   Diagnosis Date Noted  . DUB (dysfunctional uterine bleeding) 08/29/2015  . H/O iron deficiency anemia 08/29/2015  . Hidradenitis 08/29/2015    Past Surgical History  Procedure Laterality Date  . Tonsillectomy    . Cesarean section    . Cyst removed      Current Outpatient Rx  Name  Route  Sig  Dispense  Refill  . brompheniramine-pseudoephedrine-DM 30-2-10 MG/5ML syrup   Oral   Take 10 mLs by mouth 4 (four) times daily as needed.   200 mL   0   . doxycycline (VIBRA-TABS) 100 MG tablet   Oral   Take 1 tablet (100 mg total) by mouth 2 (two) times daily.   28 tablet   1   . ferrous sulfate 325 (65 FE) MG tablet   Oral   Take 325 mg by mouth daily with breakfast.         . fluticasone (FLONASE) 50 MCG/ACT nasal spray   Each Nare   Place 2 sprays into both nostrils daily.   16 g   0      Allergies Peanut-containing drug products and Zyrtec  Family History  Problem Relation Age of Onset  . Cancer Mother     remission  . Hypertension Father   . Healthy Daughter   . Healthy Son   . Cancer Maternal Grandmother     breast    Social History Social History  Substance Use Topics  . Smoking status: Never Smoker   . Smokeless tobacco: None  . Alcohol Use: No    Review of Systems  Constitutional: No fever/chills. Eyes: No visual changes. ENT: No sore throat. Cardiovascular: Denies chest pain. Respiratory: Denies shortness of breath. Gastrointestinal: No abdominal pain.  No nausea, no vomiting.  No diarrhea.  No constipation. Genitourinary: Negative for dysuria. Musculoskeletal: Negative for back pain. Skin: Positive for hives. Neurological: Negative for headaches, focal weakness or numbness.  10-point ROS otherwise negative.  ____________________________________________   PHYSICAL EXAM:  VITAL SIGNS: ED Triage Vitals  Enc Vitals Group     BP 10/30/15 0436 120/73 mmHg     Pulse Rate 10/30/15 0436 98     Resp 10/30/15 0436 20     Temp 10/30/15 0436 97.6 F (36.4 C)     Temp Source 10/30/15 0436 Oral     SpO2 10/30/15 0436 98 %     Weight 10/30/15 0436 206 lb (93.441 kg)     Height 10/30/15 0436  (1.6 m)  Head Cir --      Peak Flow --      Pain Score --      Pain Loc --      Pain Edu? --      Excl. in GC? --     Constitutional: Alert and oriented. Well appearing and in no acute distress. Eyes: Conjunctivae are normal. PERRL. EOMI. Head: Atraumatic. Nose: No congestion/rhinnorhea. Mouth/Throat: Mucous membranes are moist.  Oropharynx non-erythematous.  No angioedema. No tongue swelling. No hoarse or muffled voice. No drooling. Neck: No stridor.  Soft submental space. Cardiovascular: Normal rate, regular rhythm. Grossly normal heart sounds.  Good peripheral circulation. Respiratory: Normal respiratory effort.  No retractions. Lungs  CTAB. No wheezing. Gastrointestinal: Soft and nontender. No distention. No abdominal bruits. No CVA tenderness. Musculoskeletal: No lower extremity tenderness nor edema.  No joint effusions. Neurologic:  Normal speech and language. No gross focal neurologic deficits are appreciated. No gait instability. Skin:  Skin is warm, dry and intact. Fading urticaria noted to bilateral forearms, bilateral thighs, trunk. No petechiae. Psychiatric: Mood and affect are normal. Speech and behavior are normal.  ____________________________________________   LABS (all labs ordered are listed, but only abnormal results are displayed)  Labs Reviewed - No data to display ____________________________________________  EKG  None ____________________________________________  RADIOLOGY  None ____________________________________________   PROCEDURES  Procedure(s) performed: None  Critical Care performed: No  ____________________________________________   INITIAL IMPRESSION / ASSESSMENT AND PLAN / ED COURSE  Pertinent labs & imaging results that were available during my care of the patient were reviewed by me and considered in my medical decision making (see chart for details).  25 year old female who presents with mild acute allergic reaction with urticaria. Will place on steroid burst, Pepcid, Atarax and referred to ENT for allergy testing. Strict return precautions given. Patient verbalizes understanding and agrees with plan of care. ____________________________________________   FINAL CLINICAL IMPRESSION(S) / ED DIAGNOSES  Final diagnoses:  Allergic reaction, initial encounter  Urticaria      Irean HongJade J Sung, MD 10/30/15 [phone]

## 2015-10-30 NOTE — ED Notes (Addendum)
Patient ambulatory to triage with steady gait, without difficulty or distress noted; pt reports awoke with generalized itching & hives with no known cause; 2 benadryl taking hr PTA

## 2015-10-30 NOTE — Discharge Instructions (Signed)
1. Take the following medicines for the next 4 days: °Prednisone 60mg daily °Pepcid 20mg twice daily °2. Take Atarax (#20) as needed for itching. °3. Return to the ER for worsening symptoms, persistent vomiting, difficulty breathing or other concerns. ° °Allergies °An allergy is an abnormal reaction to a substance by the body's defense system (immune system). Allergies can develop at any age. °WHAT CAUSES ALLERGIES? °An allergic reaction happens when the immune system mistakenly reacts to a normally harmless substance, called an allergen, as if it were harmful. The immune system releases antibodies to fight the substance. Antibodies eventually release a chemical called histamine into the bloodstream. The release of histamine is meant to protect the body from infection, but it also causes discomfort. °An allergic reaction can be triggered by: °· Eating an allergen. °· Inhaling an allergen. °· Touching an allergen. °WHAT TYPES OF ALLERGIES ARE THERE? °There are many types of allergies. Common types include: °· Seasonal allergies. People with this type of allergy are usually allergic to substances that are only present during certain seasons, such as molds and pollens. °· Food allergies. °· Drug allergies. °· Insect allergies. °· Animal dander allergies. °WHAT ARE SYMPTOMS OF ALLERGIES? °Possible allergy symptoms include: °· Swelling of the lips, face, tongue, mouth, or throat. °· Sneezing, coughing, or wheezing. °· Nasal congestion. °· Tingling in the mouth. °· Rash. °· Itching. °· Itchy, red, swollen areas of skin (hives). °· Watery eyes. °· Vomiting. °· Diarrhea. °· Dizziness. °· Lightheadedness. °· Fainting. °· Trouble breathing or swallowing. °· Chest tightness. °· Rapid heartbeat. °HOW ARE ALLERGIES DIAGNOSED? °Allergies are diagnosed with a medical and family history and one or more of the following: °· Skin tests. °· Blood tests. °· A food diary. A food diary is a record of all the foods and drinks you have in  a day and of all the symptoms you experience. °· The results of an elimination diet. An elimination diet involves eliminating foods from your diet and then adding them back in one by one to find out if a certain food causes an allergic reaction. °HOW ARE ALLERGIES TREATED? °There is no cure for allergies, but allergic reactions can be treated with medicine. Severe reactions usually need to be treated at a hospital. °HOW CAN REACTIONS BE PREVENTED? °The best way to prevent an allergic reaction is by avoiding the substance you are allergic to. Allergy shots and medicines can also help prevent reactions in some cases. People with severe allergic reactions may be able to prevent a life-threatening reaction called anaphylaxis with a medicine given right after exposure to the allergen. °  °This information is not intended to replace advice given to you by your health care provider. Make sure you discuss any questions you have with your health care provider. °  °Document Released: 09/30/2002 Document Revised: 07/28/2014 Document Reviewed: 04/18/2014 °Elsevier Interactive Patient Education ©2016 Elsevier Inc. ° °Hives °Hives are itchy, red, swollen areas of the skin. They can vary in size and location on your body. Hives can come and go for hours or several days (acute hives) or for several weeks (chronic hives). Hives do not spread from person to person (noncontagious). They may get worse with scratching, exercise, and emotional stress. °CAUSES  °· Allergic reaction to food, additives, or drugs. °· Infections, including the common cold. °· Illness, such as vasculitis, lupus, or thyroid disease. °· Exposure to sunlight, heat, or cold. °· Exercise. °· Stress. °· Contact with chemicals. °SYMPTOMS  °· Red or white swollen patches   on the skin. The patches may change size, shape, and location quickly and repeatedly.  Itching.  Swelling of the hands, feet, and face. This may occur if hives develop deeper in the  skin. DIAGNOSIS  Your caregiver can usually tell what is wrong by performing a physical exam. Skin or blood tests may also be done to determine the cause of your hives. In some cases, the cause cannot be determined. TREATMENT  Mild cases usually get better with medicines such as antihistamines. Severe cases may require an emergency epinephrine injection. If the cause of your hives is known, treatment includes avoiding that trigger.  HOME CARE INSTRUCTIONS   Avoid causes that trigger your hives.  Take antihistamines as directed by your caregiver to reduce the severity of your hives. Non-sedating or low-sedating antihistamines are usually recommended. Do not drive while taking an antihistamine.  Take any other medicines prescribed for itching as directed by your caregiver.  Wear loose-fitting clothing.  Keep all follow-up appointments as directed by your caregiver. SEEK MEDICAL CARE IF:   You have persistent or severe itching that is not relieved with medicine.  You have painful or swollen joints. SEEK IMMEDIATE MEDICAL CARE IF:   You have a fever.  Your tongue or lips are swollen.  You have trouble breathing or swallowing.  You feel tightness in the throat or chest.  You have abdominal pain. These problems may be the first sign of a life-threatening allergic reaction. Call your local emergency services (911 in [place] MAKE SURE YOU:   Understand these instructions.  Will watch your condition.  Will get help right away if you are not doing well or get worse.   This information is not intended to replace advice given to you by your health care provider. Make sure you discuss any questions you have with your health care provider.   Document Released: 07/07/2005 Document Revised: 07/12/2013 Document Reviewed: 09/30/2011 Elsevier Interactive Patient Education [employer] Elsevier Inc.

## 2015-10-30 NOTE — ED Notes (Signed)
Pt reports hives all over her body - palms of hands are itching - pt took Benadryl at 3am - denies shortness of breath but c/o headache - Pt denies eating or drinking anything new and denies taking any new medication - she states this is the 3rd time this has happened in the last month

## 2015-12-05 ENCOUNTER — Inpatient Hospital Stay: Payer: Self-pay | Admitting: Family Medicine

## 2016-01-21 ENCOUNTER — Emergency Department
Admission: EM | Admit: 2016-01-21 | Discharge: 2016-01-21 | Disposition: A | Discharge status: Home / Self Care | Payer: Self-pay | Attending: Emergency Medicine | Admitting: Emergency Medicine

## 2016-01-21 ENCOUNTER — Encounter: Payer: Self-pay | Admitting: Emergency Medicine

## 2016-01-21 DIAGNOSIS — T7840XA Allergy, unspecified, initial encounter: Secondary | ICD-10-CM | POA: Insufficient documentation

## 2016-01-21 DIAGNOSIS — L509 Urticaria, unspecified: Secondary | ICD-10-CM | POA: Insufficient documentation

## 2016-01-21 DIAGNOSIS — R197 Diarrhea, unspecified: Secondary | ICD-10-CM | POA: Insufficient documentation

## 2016-01-21 LAB — COMPREHENSIVE METABOLIC PANEL
ALBUMIN: 4.1 g/dL (ref 3.5–5.0)
ALT: 13 U/L — AB (ref 14–54)
AST: 19 U/L (ref 15–41)
Alkaline Phosphatase: 44 U/L (ref 38–126)
Anion gap: 6 (ref 5–15)
BUN: 12 mg/dL (ref 6–20)
CHLORIDE: 108 mmol/L (ref 101–111)
CO2: 24 mmol/L (ref 22–32)
CREATININE: 0.71 mg/dL (ref 0.44–1.00)
Calcium: 9.1 mg/dL (ref 8.9–10.3)
GFR calc Af Amer: >60 mL/min (ref >60)
GFR calc non Af Amer: >60 mL/min (ref >60)
GLUCOSE: 122 mg/dL — AB (ref 65–99)
POTASSIUM: 3.3 mmol/L — AB (ref 3.5–5.1)
Sodium: 138 mmol/L (ref 135–145)
Total Bilirubin: 0.3 mg/dL (ref 0.3–1.2)
Total Protein: 7.5 g/dL (ref 6.5–8.1)

## 2016-01-21 LAB — URINALYSIS COMPLETE WITH MICROSCOPIC (ARMC ONLY)
Bilirubin Urine: NEGATIVE
GLUCOSE, UA: NEGATIVE mg/dL
Ketones, ur: NEGATIVE mg/dL
Nitrite: NEGATIVE
Protein, ur: NEGATIVE mg/dL
Specific Gravity, Urine: 1.017 (ref 1.005–1.030)
pH: 5 (ref 5.0–8.0)

## 2016-01-21 LAB — LIPASE, BLOOD: LIPASE: 23 U/L (ref 11–51)

## 2016-01-21 LAB — CBC
HEMATOCRIT: 33.8 % — AB (ref 35.0–47.0)
Hemoglobin: 11.6 g/dL — ABNORMAL LOW (ref 12.0–16.0)
MCH: 31 pg (ref 26.0–34.0)
MCHC: 34.4 g/dL (ref 32.0–36.0)
MCV: 90.4 fL (ref 80.0–100.0)
Platelets: 320 10*3/uL (ref 150–440)
RBC: 3.74 MIL/uL — ABNORMAL LOW (ref 3.80–5.20)
RDW: 13.9 % (ref 11.5–14.5)
WBC: 3.8 10*3/uL (ref 3.6–11.0)

## 2016-01-21 MED ORDER — FAMOTIDINE 20 MG PO TABS: 20.0000 mg | ORAL_TABLET | Freq: Two times a day (BID) | ORAL | Status: DC

## 2016-01-21 MED ORDER — PREDNISONE 20 MG PO TABS
60.0000 mg | ORAL_TABLET | Freq: Once | ORAL | Status: AC
  Administered 2016-01-21: 60 mg via ORAL
  Filled 2016-01-21: qty 3

## 2016-01-21 MED ORDER — PREDNISONE 20 MG PO TABS: ORAL_TABLET | ORAL | Status: DC

## 2016-01-21 MED ORDER — EPINEPHRINE 0.3 MG/0.3ML IJ SOAJ: 0.3000 mg | Freq: Once | INTRAMUSCULAR | Status: DC

## 2016-01-21 MED ORDER — FAMOTIDINE 20 MG PO TABS
20.0000 mg | ORAL_TABLET | Freq: Once | ORAL | Status: AC
  Administered 2016-01-21: 20 mg via ORAL
  Filled 2016-01-21: qty 1

## 2016-01-21 NOTE — ED Notes (Signed)
POC Urine Preg Done; Negative Result 

## 2016-01-21 NOTE — ED Provider Notes (Signed)
[facility] [place] Emergency Department Provider Note   ____________________________________________  Time seen: Approximately 2:15 AM  I have reviewed the triage vital signs and the nursing notes.   HISTORY  Chief Complaint Urticaria; Diarrhea; and Abdominal Pain    HPI Kristina Welch is a 25 y.o. female who presents to the ED from home with a chief complaint of allergic reaction. 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 Patient took 50 mg Benadryl prior to arrival and hives have almost resolved. States she had slight lower lip swelling without airway or respiratory distress or wheezing. Denies new exposures such as medicines, over the counters. States she ate chicken and cheese for dinner which she has eaten previously. Denies recent fever, chills, chest pain, shortness of breath, nausea, vomiting, dysuria. Denies recent travel or trauma.   Past Medical History  Diagnosis Date  . History of iron deficiency anemia     Patient Active Problem List   Diagnosis Date Noted  . DUB (dysfunctional uterine bleeding) 08/29/2015  . H/O iron deficiency anemia 08/29/2015  . Hidradenitis 08/29/2015    Past Surgical History  Procedure Laterality Date  . Tonsillectomy    . Cesarean section    . Cyst removed      Current Outpatient Rx  Name  Route  Sig  Dispense  Refill  . brompheniramine-pseudoephedrine-DM 30-2-10 MG/5ML syrup   Oral   Take 10 mLs by mouth 4 (four) times daily as needed.   200 mL   0   . doxycycline (VIBRA-TABS) 100 MG tablet   Oral   Take 1 tablet (100 mg total) by mouth 2 (two) times daily.   28 tablet   1   . EPINEPHrine 0.3 mg/0.3 mL IJ SOAJ injection   Intramuscular   Inject 0.3 mLs (0.3 mg total) into the muscle once.   1 Device   0   . famotidine (PEPCID) 20 MG tablet   Oral   Take 1 tablet (20 mg total) by mouth 2  (two) times daily.   8 tablet   0   . ferrous sulfate 325 (65 FE) MG tablet   Oral   Take 325 mg by mouth daily with breakfast.         . fluticasone (FLONASE) 50 MCG/ACT nasal spray   Each Nare   Place 2 sprays into both nostrils daily.   16 g   0   . hydrOXYzine (ATARAX/VISTARIL) 25 MG tablet   Oral   Take 1 tablet (25 mg total) by mouth every 4 (four) hours as needed for itching.   20 tablet   0   . predniSONE (DELTASONE) 20 MG tablet      3 tablets daily x 4 days   12 tablet   0     Allergies Peanut-containing drug products and Zyrtec  Family History  Problem Relation Age of Onset  . Cancer Mother     remission  . Hypertension Father   . Healthy Daughter   . Healthy Son   . Cancer Maternal Grandmother     breast    Social History Social History  Substance Use Topics  . Smoking status: Never Smoker   . Smokeless tobacco: None  . Alcohol Use: No    Review of Systems  Constitutional: No fever/chills. Eyes: No visual changes. ENT: No sore throat. Cardiovascular: Denies chest pain. Respiratory: Denies shortness of breath. Gastrointestinal: No  abdominal pain.  No nausea, no vomiting.  Positive for diarrhea.  No constipation. Genitourinary: Negative for dysuria. Musculoskeletal: Negative for back pain. Skin: Positive for rash. Neurological: Negative for headaches, focal weakness or numbness.  10-point ROS otherwise negative.  ____________________________________________   PHYSICAL EXAM:  VITAL SIGNS: ED Triage Vitals  Enc Vitals Group     BP 01/21/16 0045 117/58 mmHg     Pulse Rate 01/21/16 0045 78     Resp 01/21/16 0045 18     Temp 01/21/16 0045 98.7 F (37.1 C)     Temp Source 01/21/16 0045 Oral     SpO2 01/21/16 0045 99 %     Weight 01/21/16 0045 197 lb (89.359 kg)     Height 01/21/16 0045 5\' 3"  (1.6 m)     Head Cir --      Peak Flow --      Pain Score 01/21/16 0048 0     Pain Loc --      Pain Edu? --      Excl. in GC? --      Constitutional: Alert and oriented. Well appearing and in no acute distress. Eyes: Conjunctivae are normal. PERRL. EOMI. Head: Atraumatic. Nose: No congestion/rhinnorhea. Mouth/Throat: Mucous membranes are moist.  Oropharynx non-erythematous.  There is no tongue or facial swelling. There is no hoarse or muffled voice. There is no drooling. Neck: No stridor.  Soft submental space. Cardiovascular: Normal rate, regular rhythm. Grossly normal heart sounds.  Good peripheral circulation. Respiratory: Normal respiratory effort.  No retractions. Lungs CTAB. No wheezing. Gastrointestinal: Soft and nontender. No distention. No abdominal bruits. No CVA tenderness. Musculoskeletal: No lower extremity tenderness nor edema.  No joint effusions. Neurologic:  Normal speech and language. No gross focal neurologic deficits are appreciated. No gait instability. Skin:  Skin is warm, dry and intact. No petechiae noted. Slight urticaria to upper arms. Psychiatric: Mood and affect are normal. Speech and behavior are normal.  ____________________________________________   LABS (all labs ordered are listed, but only abnormal results are displayed)  Labs Reviewed  COMPREHENSIVE METABOLIC PANEL - Abnormal; Notable for the following:    Potassium 3.3 (*)    Glucose, Bld 122 (*)    ALT 13 (*)    All other components within normal limits  CBC - Abnormal; Notable for the following:    RBC 3.74 (*)    Hemoglobin 11.6 (*)    HCT 33.8 (*)    All other components within normal limits  URINALYSIS COMPLETEWITH MICROSCOPIC ([facility] ONLY) - Abnormal; Notable for the following:    Color, Urine YELLOW (*)    APPearance CLOUDY (*)    Hgb urine dipstick 3+ (*)    Leukocytes, UA TRACE (*)    Bacteria, UA RARE (*)    Squamous Epithelial / LPF 6-30 (*)    All other components within normal limits  LIPASE, BLOOD  POC URINE PREG, ED    ____________________________________________  EKG  None ____________________________________________  RADIOLOGY  None ____________________________________________   PROCEDURES  Procedure(s) performed: None  Critical Care performed: No  ____________________________________________   INITIAL IMPRESSION / ASSESSMENT AND PLAN / ED COURSE  Pertinent labs & imaging results that were available during my care of the patient were reviewed by me and considered in my medical decision making (see chart for details).  25 year old female who presents with acute allergic reaction with urticaria which have almost completely resolved. Will place on a 5 day burst of prednisone and Pepcid; prescription for EpiPen and follow-up with  ENT for allergy testing. Strict return precautions given. Patient verbalizes understanding and agrees with plan of care. ____________________________________________   FINAL CLINICAL IMPRESSION(S) / ED DIAGNOSES  Final diagnoses:  Allergic reaction, initial encounter  Urticaria      NEW MEDICATIONS STARTED DURING THIS VISIT:  New Prescriptions   EPINEPHRINE 0.3 MG/0.3 ML IJ SOAJ INJECTION    Inject 0.3 mLs (0.3 mg total) into the muscle once.   FAMOTIDINE (PEPCID) 20 MG TABLET    Take 1 tablet (20 mg total) by mouth 2 (two) times daily.   PREDNISONE (DELTASONE) 20 MG TABLET    3 tablets daily x 4 days     Note:  This document was prepared using Dragon voice recognition software and may include unintentional dictation errors.    Irean HongJade J Uriyah Massimo, MD 01/21/16 812 [phone]

## 2016-01-21 NOTE — ED Notes (Signed)
Pt says about 1 hour ago, while doing her daughter's hair, she had sudden onset of diarrhea with abd cramps; after using the bathroom cramps go away; after the first episode of diarrhea pt broke out in hives "all over her body"; took 50mg  Benadryl just after hives started; hives have not worsened; pt with scattered hives to upper arms, lower left abd and mid back; denies difficulty breathing;

## 2016-01-21 NOTE — Discharge Instructions (Signed)
1. Take the following medicines for the next 4 days: °Prednisone 60mg daily °Pepcid 20mg twice daily °2. Take Benadryl as needed for itching. °3. Use Epi-Pen in case of acute, life-threatening allergic reaction. °4. Return to the ER for worsening symptoms, persistent vomiting, difficulty breathing or other concerns. ° °Allergies °An allergy is an abnormal reaction to a substance by the body's defense system (immune system). Allergies can develop at any age. °WHAT CAUSES ALLERGIES? °An allergic reaction happens when the immune system mistakenly reacts to a normally harmless substance, called an allergen, as if it were harmful. The immune system releases antibodies to fight the substance. Antibodies eventually release a chemical called histamine into the bloodstream. The release of histamine is meant to protect the body from infection, but it also causes discomfort. °An allergic reaction can be triggered by: °· Eating an allergen. °· Inhaling an allergen. °· Touching an allergen. °WHAT TYPES OF ALLERGIES ARE THERE? °There are many types of allergies. Common types include: °· Seasonal allergies. People with this type of allergy are usually allergic to substances that are only present during certain seasons, such as molds and pollens. °· Food allergies. °· Drug allergies. °· Insect allergies. °· Animal dander allergies. °WHAT ARE SYMPTOMS OF ALLERGIES? °Possible allergy symptoms include: °· Swelling of the lips, face, tongue, mouth, or throat. °· Sneezing, coughing, or wheezing. °· Nasal congestion. °· Tingling in the mouth. °· Rash. °· Itching. °· Itchy, red, swollen areas of skin (hives). °· Watery eyes. °· Vomiting. °· Diarrhea. °· Dizziness. °· Lightheadedness. °· Fainting. °· Trouble breathing or swallowing. °· Chest tightness. °· Rapid heartbeat. °HOW ARE ALLERGIES DIAGNOSED? °Allergies are diagnosed with a medical and family history and one or more of the following: °· Skin tests. °· Blood tests. °· A food diary.  A food diary is a record of all the foods and drinks you have in a day and of all the symptoms you experience. °· The results of an elimination diet. An elimination diet involves eliminating foods from your diet and then adding them back in one by one to find out if a certain food causes an allergic reaction. °HOW ARE ALLERGIES TREATED? °There is no cure for allergies, but allergic reactions can be treated with medicine. Severe reactions usually need to be treated at a hospital. °HOW CAN REACTIONS BE PREVENTED? °The best way to prevent an allergic reaction is by avoiding the substance you are allergic to. Allergy shots and medicines can also help prevent reactions in some cases. People with severe allergic reactions may be able to prevent a life-threatening reaction called anaphylaxis with a medicine given right after exposure to the allergen. °  °This information is not intended to replace advice given to you by your health care provider. Make sure you discuss any questions you have with your health care provider. °  °Document Released: 09/30/2002 Document Revised: 07/28/2014 Document Reviewed: 04/18/2014 °Elsevier Interactive Patient Education ©2016 [employer] ° °Hives °Hives are itchy, red, swollen areas of the skin. They can vary in size and location on your body. Hives can come and go for hours or several days (acute hives) or for several weeks (chronic hives). Hives do not spread from person to person (noncontagious). They may get worse with scratching, exercise, and emotional stress. °CAUSES  °· Allergic reaction to food, additives, or drugs. °· Infections, including the common cold. °· Illness, such as vasculitis, lupus, or thyroid disease. °· Exposure to sunlight, heat, or cold. °· Exercise. °· Stress. °· Contact with   chemicals. °SYMPTOMS  °· Red or white swollen patches on the skin. The patches may change size, shape, and location quickly and repeatedly. °· Itching. °· Swelling of the hands, feet, and  face. This may occur if hives develop deeper in the skin. °DIAGNOSIS  °Your caregiver can usually tell what is wrong by performing a physical exam. Skin or blood tests may also be done to determine the cause of your hives. In some cases, the cause cannot be determined. °TREATMENT  °Mild cases usually get better with medicines such as antihistamines. Severe cases may require an emergency epinephrine injection. If the cause of your hives is known, treatment includes avoiding that trigger.  °HOME CARE INSTRUCTIONS  °· Avoid causes that trigger your hives. °· Take antihistamines as directed by your caregiver to reduce the severity of your hives. Non-sedating or low-sedating antihistamines are usually recommended. Do not drive while taking an antihistamine. °· Take any other medicines prescribed for itching as directed by your caregiver. °· Wear loose-fitting clothing. °· Keep all follow-up appointments as directed by your caregiver. °SEEK MEDICAL CARE IF:  °· You have persistent or severe itching that is not relieved with medicine. °· You have painful or swollen joints. °SEEK IMMEDIATE MEDICAL CARE IF:  °· You have a fever. °· Your tongue or lips are swollen. °· You have trouble breathing or swallowing. °· You feel tightness in the throat or chest. °· You have abdominal pain. °These problems may be the first sign of a life-threatening allergic reaction. Call your local emergency services (911 in [place] °MAKE SURE YOU:  °· Understand these instructions. °· Will watch your condition. °· Will get help right away if you are not doing well or get worse. °  °This information is not intended to replace advice given to you by your health care provider. Make sure you discuss any questions you have with your health care provider. °  °Document Released: 07/07/2005 Document Revised: 07/12/2013 Document Reviewed: 09/30/2011 °Elsevier Interactive Patient Education ©2016 Else[employer] Inc. ° °

## 2016-02-25 ENCOUNTER — Emergency Department
Admission: EM | Admit: 2016-02-25 | Discharge: 2016-02-25 | Disposition: A | Discharge status: Home / Self Care | Payer: Self-pay | Attending: Emergency Medicine | Admitting: Emergency Medicine

## 2016-02-25 DIAGNOSIS — L509 Urticaria, unspecified: Secondary | ICD-10-CM | POA: Insufficient documentation

## 2016-02-25 DIAGNOSIS — Z79899 Other long term (current) drug therapy: Secondary | ICD-10-CM | POA: Insufficient documentation

## 2016-02-25 DIAGNOSIS — Z792 Long term (current) use of antibiotics: Secondary | ICD-10-CM | POA: Insufficient documentation

## 2016-02-25 MED ORDER — METHYLPREDNISOLONE SODIUM SUCC 125 MG IJ SOLR
125.0000 mg | Freq: Once | INTRAMUSCULAR | Status: AC
  Administered 2016-02-25: 125 mg via INTRAMUSCULAR
  Filled 2016-02-25: qty 2

## 2016-02-25 MED ORDER — DIPHENHYDRAMINE HCL 50 MG/ML IJ SOLN
50.0000 mg | Freq: Once | INTRAMUSCULAR | Status: AC
  Administered 2016-02-25: 50 mg via INTRAMUSCULAR
  Filled 2016-02-25: qty 1

## 2016-02-25 MED ORDER — PREDNISONE 20 MG PO TABS: ORAL_TABLET | ORAL | 0 refills | Status: DC

## 2016-02-25 NOTE — ED Triage Notes (Addendum)
Pt presents to ED with c/o urticaria that started around 230am and woke her from sleep. Pt states she took 2 Benadryl tablets at 240am without relief. Pt with hives to inner thighs, back, torso, and face. Pt denies shortness of breath, chest pain, nausea or vomiting. Pt denies any recent change in lotions, soaps, detergents, etc or other environmental exposures to account for reaction.

## 2016-02-25 NOTE — ED Provider Notes (Signed)
Time Seen: Approximately 337 I have reviewed the triage notes  Chief Complaint: Urticaria   History of Present Illness: Kristina Welch is a 25 y.o. female *who's had a previous history of hydradenitis superlative along with periodic outbreaks of urticaria. She states that they seem to occur at the same time. She is not currently on any antibiotic therapy. She has true hive-like rash that appears on her legs and upper extremities and lower abdominal region. She is unaware of any new exposures or any obvious or hereditary component. She denies any shortness of breath or chest tightness. She states she woke up early this morning at 2:30 and noticed the itching and took 2 Benadryl at home she states the upper extremity hives have decreased though she still has hives present on her lower extremities and lower abdominal region. She denies any trouble with speech or swallowing   Past Medical History:  Diagnosis Date  . History of iron deficiency anemia     Patient Active Problem List   Diagnosis Date Noted  . DUB (dysfunctional uterine bleeding) 08/29/2015  . H/O iron deficiency anemia 08/29/2015  . Hidradenitis 08/29/2015    Past Surgical History:  Procedure Laterality Date  . CESAREAN SECTION    . cyst removed    . TONSILLECTOMY      Past Surgical History:  Procedure Laterality Date  . CESAREAN SECTION    . cyst removed    . TONSILLECTOMY      Current Outpatient Rx  . Order #: [id number] Class: Print  . Order #: [id number] Class: Normal  . Order #: [id number] Class: Print  . Order #: [id number] Class: Print  . Order #: [id number] Class: Historical Med  . Order #: [id number] Class: Print  . Order #: [id number] Class: Print  . Order #: [id number] Class: Print    Allergies:  Peanut-containing drug products and Zyrtec [cetirizine]  Family History: Family History  Problem Relation Age of Onset  . Cancer Mother     remission  . Hypertension Father   . Healthy Daughter   .  Healthy Son   . Cancer Maternal Grandmother     breast    Social History: Social History  Substance Use Topics  . Smoking status: Never Smoker  . Smokeless tobacco: Never Used  . Alcohol use No     Review of Systems:   10 point review of systems was performed and was otherwise negative:  Constitutional: No fever Eyes: No visual disturbances ENT: No sore throat, ear pain Cardiac: No chest pain Respiratory: No shortness of breath, wheezing, or stridor Abdomen: No abdominal pain, no vomiting, No diarrhea Endocrine: No weight loss, No night sweats Extremities: No peripheral edema, cyanosis Skin: No rashes, easy bruising Neurologic: No focal weakness, trouble with speech or swollowing Urologic: No dysuria, Hematuria, or urinary frequency No new soaps detergents pedal exposures, etc.  Physical Exam:  ED Triage Vitals [02/25/16 0319]  Enc Vitals Group     BP 137/86     Pulse Rate (!) 123     Resp      Temp 97.8 F (36.6 C)     Temp Source Oral     SpO2 98 %     Weight 180 lb (81.6 kg)     Height  (1.6 m)     Head Circumference      Peak Flow      Pain Score 0     Pain Loc      Pain Edu?      Excl. in  GC?     General: Awake , Alert , and Oriented times 3; GCS 15 Head: Normal cephalic , atraumatic Eyes: Pupils equal , round, reactive to light Nose/Throat: No nasal drainage, patent upper airway without erythema or exudate.  Neck: Supple, Full range of motion, No anterior adenopathy or palpable thyroid masses Lungs: Clear to ascultation without wheezes , rhonchi, or rales Heart: Regular rate, regular rhythm without murmurs , gallops , or rubs Abdomen: Soft, non tender without rebound, guarding , or rigidity; bowel sounds positive and symmetric in all 4 quadrants. No organomegaly .        Extremities:Multiple old in the left axillary area, indurated to palpation with no erythema 2 plus symmetric pulses. No edema, clubbing or cyanosis Neurologic: normal  ambulation, Motor symmetric without deficits, sensory intact Skin: Hive-like rash especially in the lower abdominal region outside surface of her right lower extremity   Labs:   All laboratory work was reviewed including any pertinent negatives or positives listed below:  Labs Reviewed - No data to display    ED Course: * Patient received IM Benadryl and IM Solu-Medrol which she states is work for her in the past. Patient tolerated this well and had improvement of her hives. She has no signs of respiratory distress or anaphylaxis. Patient will be discharged with continued over-the-counter Benadryl and a prednisone bolus. Her blood located on the left axillary area does not appear to be drainable at this time * Clinical Course     Assessment:  Urticaria Hidradenitis    Plan: Outpatient Prescription for prednisone Patient was advised to return immediately if condition worsens. Patient was advised to follow up with their primary care physician or other specialized physicians involved in their outpatient care. The patient and/or family member/power of attorney had laboratory results reviewed at the bedside. All questions and concerns were addressed and appropriate discharge instructions were distributed by the nursing staff.             Jennye MoccasinBrian S Wessley Emert, MD 02/25/16 [phone]

## 2016-02-25 NOTE — Discharge Instructions (Signed)
Please return immediately if condition worsens. Please contact her primary physician or the physician you were given for referral. If you have any specialist physicians involved in her treatment and plan please also contact them. Thank you for using [facility]. ° °

## 2016-02-25 NOTE — ED Notes (Signed)
MD Quigley at bedside. 

## 2016-02-25 NOTE — ED Notes (Signed)
Pt reports of hidradenitis. Pt reports the rash starts under her arms, and then spreads to the rest of her body. Pt has rash to bilateral upper legs, upper arms, back and abdomen. Pt denies SOB. Pt reports she took 2 benadryls at 0240. Pt denies pain, however reports generalized itching sensation.

## 2016-02-25 NOTE — ED Notes (Signed)
  Reviewed d/c instructions, follow-up care, and prescriptions with pt. Pt verbalized understanding 

## 2016-03-02 ENCOUNTER — Encounter: Payer: Self-pay | Admitting: Emergency Medicine

## 2016-03-02 ENCOUNTER — Emergency Department
Admission: EM | Admit: 2016-03-02 | Discharge: 2016-03-02 | Disposition: A | Discharge status: Home / Self Care | Payer: Self-pay | Attending: Emergency Medicine | Admitting: Emergency Medicine

## 2016-03-02 DIAGNOSIS — Z79899 Other long term (current) drug therapy: Secondary | ICD-10-CM | POA: Insufficient documentation

## 2016-03-02 DIAGNOSIS — L509 Urticaria, unspecified: Secondary | ICD-10-CM | POA: Insufficient documentation

## 2016-03-02 NOTE — ED Triage Notes (Signed)
Pt states she woke up and took her husband to work this morning and started breaking out in hives in her groin, stomach and back area.  Pt seen here recently for the same. Pt states she took 2 Benadryl Liquigels around 0800 today.  States some improvement since then, but c/o itching. Also states she had 1 episode of diarrhea when the hives started

## 2016-03-02 NOTE — ED Notes (Signed)
See triage note  States she developed  A rash generalized rash this am  Positive itching  States she took a benadryl and some of the rash and itching has subsided.  No resp distress noted

## 2016-03-02 NOTE — ED Provider Notes (Signed)
[facility] Emergency Department Provider Note   ____________________________________________    I have reviewed the triage vital signs and the nursing notes.   HISTORY  Chief Complaint Urticaria     HPI Kristina Welch is a 25 y.o. female who presents with complaints of hives. She reports she frequently develops hives when she leaves her house, it is not known what the allergen is. She has follow-up with an allergist in 1 month. She denies intraoral swelling or difficulty breathing. She took 2 Benadryl today which has improved her rash.   Past Medical History:  Diagnosis Date  . History of iron deficiency anemia     Patient Active Problem List   Diagnosis Date Noted  . DUB (dysfunctional uterine bleeding) 08/29/2015  . H/O iron deficiency anemia 08/29/2015  . Hidradenitis 08/29/2015    Past Surgical History:  Procedure Laterality Date  . CESAREAN SECTION    . cyst removed    . TONSILLECTOMY      Prior to Admission medications   Medication Sig Start Date End Date Taking? Authorizing Provider  brompheniramine-pseudoephedrine-DM 30-2-10 MG/5ML syrup Take 10 mLs by mouth 4 (four) times daily as needed. 10/02/15   Jami L Hagler, PA-C  doxycycline (VIBRA-TABS) 100 MG tablet Take 1 tablet (100 mg total) by mouth 2 (two) times daily. 10/03/15   Anola Gurney, PA  EPINEPHrine 0.3 mg/0.3 mL IJ SOAJ injection Inject 0.3 mLs (0.3 mg total) into the muscle once. 01/21/16   Irean Hong, MD  famotidine (PEPCID) 20 MG tablet Take 1 tablet (20 mg total) by mouth 2 (two) times daily. 01/21/16   Irean Hong, MD  ferrous sulfate 325 (65 FE) MG tablet Take 325 mg by mouth daily with breakfast.    Historical Provider, MD  fluticasone (FLONASE) 50 MCG/ACT nasal spray Place 2 sprays into both nostrils daily. 10/02/15   Jami L Hagler, PA-C  hydrOXYzine (ATARAX/VISTARIL) 25 MG tablet Take 1 tablet (25 mg total) by mouth every 4 (four) hours as needed for itching.  10/30/15   Irean Hong, MD  predniSONE (DELTASONE) 20 MG tablet 3 tablets daily x 4 days 02/25/16   Jennye Moccasin, MD     Allergies Peanut-containing drug products and Zyrtec [cetirizine]  Family History  Problem Relation Age of Onset  . Cancer Mother     remission  . Hypertension Father   . Healthy Daughter   . Healthy Son   . Cancer Maternal Grandmother     breast    Social History Social History  Substance Use Topics  . Smoking status: Never Smoker  . Smokeless tobacco: Never Used  . Alcohol use No    Review of Systems  Constitutional: No fever/chills  ENT: No sore throat.   Gastrointestinal: One episode of diarrhea  Musculoskeletal: Negative for joint swelling Skin: As above Neurological: Negative for headaches     ____________________________________________   PHYSICAL EXAM:  VITAL SIGNS: ED Triage Vitals  Enc Vitals Group     BP 03/02/16 1030 118/78     Pulse Rate 03/02/16 1030 96     Resp 03/02/16 1030 18     Temp 03/02/16 1030 98.3 F (36.8 C)     Temp Source 03/02/16 1030 Oral     SpO2 03/02/16 1030 100 %     Weight 03/02/16 1031 180 lb (81.6 kg)     Height 03/02/16 1031  (1.6 m)     Head Circumference --  Peak Flow --      Pain Score 03/02/16 1031 0     Pain Loc --      Pain Edu? --      Excl. in GC? --     Constitutional: Alert and oriented. No acute distress. Pleasant and interactive Eyes: Conjunctivae are normal.  Head: Atraumatic. Nose: No congestion/rhinnorhea. Mouth/Throat: Mucous membranes are moist.  Pharynx normal Cardiovascular: Normal rate, regular rhythm.  Respiratory: Normal respiratory effort.  No retractions. Genitourinary: deferred Musculoskeletal: No lower extremity tenderness nor edema.   Neurologic:  Normal speech and language. No gross focal neurologic deficits are appreciated.   Skin:  Skin is warm, dry and intact. Urticarial rash noted to the inner thighs  bilaterally.   ____________________________________________   LABS (all labs ordered are listed, but only abnormal results are displayed)  Labs Reviewed - No data to display ____________________________________________  EKG   ____________________________________________  RADIOLOGY  None ____________________________________________   PROCEDURES  Procedure(s) performed: No    Critical Care performed: No ____________________________________________   INITIAL IMPRESSION / ASSESSMENT AND PLAN / ED COURSE  Pertinent labs & imaging results that were available during my care of the patient were reviewed by me and considered in my medical decision making (see chart for details).  Patient presents with urticaria which is already improving after she took 2 Benadryl at home. She has a prescription for prednisone at home which I told her to fill and she will trial Claritin daily prior to seeing her allergist. Suspect environmental allergy   ____________________________________________   FINAL CLINICAL IMPRESSION(S) / ED DIAGNOSES  Final diagnoses:  Urticaria      NEW MEDICATIONS STARTED DURING THIS VISIT:  New Prescriptions   No medications on file     Note:  This document was prepared using Dragon voice recognition software and may include unintentional dictation errors.    Jene Everyobert Ledora Delker, MD 03/02/16 1051

## 2016-04-19 ENCOUNTER — Emergency Department
Admission: EM | Admit: 2016-04-19 | Discharge: 2016-04-19 | Disposition: A | Discharge status: Home / Self Care | Attending: Emergency Medicine | Admitting: Emergency Medicine

## 2016-04-19 ENCOUNTER — Encounter: Payer: Self-pay | Admitting: Emergency Medicine

## 2016-04-19 DIAGNOSIS — L02412 Cutaneous abscess of left axilla: Secondary | ICD-10-CM | POA: Diagnosis present

## 2016-04-19 DIAGNOSIS — L509 Urticaria, unspecified: Secondary | ICD-10-CM | POA: Diagnosis not present

## 2016-04-19 LAB — COMPREHENSIVE METABOLIC PANEL
ALT: 11 U/L — AB (ref 14–54)
AST: 21 U/L (ref 15–41)
Albumin: 3.8 g/dL (ref 3.5–5.0)
Alkaline Phosphatase: 55 U/L (ref 38–126)
Anion gap: 9 (ref 5–15)
BUN: 11 mg/dL (ref 6–20)
CHLORIDE: 106 mmol/L (ref 101–111)
CO2: 23 mmol/L (ref 22–32)
CREATININE: 0.85 mg/dL (ref 0.44–1.00)
Calcium: 9.2 mg/dL (ref 8.9–10.3)
GFR calc non Af Amer: >60 mL/min (ref >60)
Glucose, Bld: 139 mg/dL — ABNORMAL HIGH (ref 65–99)
POTASSIUM: 3.1 mmol/L — AB (ref 3.5–5.1)
SODIUM: 138 mmol/L (ref 135–145)
Total Bilirubin: 0.6 mg/dL (ref 0.3–1.2)
Total Protein: 7.5 g/dL (ref 6.5–8.1)

## 2016-04-19 LAB — LIPASE, BLOOD: Lipase: 24 U/L (ref 11–51)

## 2016-04-19 LAB — CBC
HCT: 35.7 % (ref 35.0–47.0)
Hemoglobin: 12 g/dL (ref 12.0–16.0)
MCH: 30 pg (ref 26.0–34.0)
MCHC: 33.5 g/dL (ref 32.0–36.0)
MCV: 89.4 fL (ref 80.0–100.0)
PLATELETS: 345 10*3/uL (ref 150–440)
RBC: 4 MIL/uL (ref 3.80–5.20)
RDW: 14.3 % (ref 11.5–14.5)
WBC: 9.5 10*3/uL (ref 3.6–11.0)

## 2016-04-19 MED ORDER — ONDANSETRON HCL 4 MG/2ML IJ SOLN
4.0000 mg | Freq: Once | INTRAMUSCULAR | Status: AC
  Administered 2016-04-19: 4 mg via INTRAVENOUS

## 2016-04-19 MED ORDER — METHYLPREDNISOLONE SODIUM SUCC 125 MG IJ SOLR
INTRAMUSCULAR | Status: AC
  Administered 2016-04-19: 125 mg via INTRAVENOUS
  Filled 2016-04-19: qty 2

## 2016-04-19 MED ORDER — ONDANSETRON HCL 4 MG/2ML IJ SOLN
INTRAMUSCULAR | Status: AC
  Administered 2016-04-19: 4 mg via INTRAVENOUS
  Filled 2016-04-19: qty 2

## 2016-04-19 MED ORDER — FAMOTIDINE IN NACL 20-0.9 MG/50ML-% IV SOLN
20.0000 mg | Freq: Once | INTRAVENOUS | Status: AC
  Administered 2016-04-19: 20 mg via INTRAVENOUS

## 2016-04-19 MED ORDER — FAMOTIDINE IN NACL 20-0.9 MG/50ML-% IV SOLN
INTRAVENOUS | Status: AC
  Administered 2016-04-19: 20 mg via INTRAVENOUS
  Filled 2016-04-19: qty 50

## 2016-04-19 MED ORDER — METHYLPREDNISOLONE SODIUM SUCC 125 MG IJ SOLR
125.0000 mg | Freq: Once | INTRAMUSCULAR | Status: AC
  Administered 2016-04-19: 125 mg via INTRAVENOUS

## 2016-04-19 MED ORDER — SODIUM CHLORIDE 0.9 % IV BOLUS (SEPSIS)
1000.0000 mL | Freq: Once | INTRAVENOUS | Status: AC
  Administered 2016-04-19: 1000 mL via INTRAVENOUS

## 2016-04-19 MED ORDER — LIDOCAINE-EPINEPHRINE (PF) 1 %-1:200000 IJ SOLN
INTRAMUSCULAR | Status: AC
  Administered 2016-04-19: 07:00:00
  Filled 2016-04-19: qty 30

## 2016-04-19 NOTE — ED Triage Notes (Signed)
Pt reports boil under left arm x 4 days, was soaking it tonight, while soaking, had bumps break out on skin that itched, pt took 2 benadryl 1hr pta, pt then developed intermittent abd pain, then had 2 episodes of diarrhea and vomited in ED bathroom.  Pt denies current nausea.  Pt denies current abd pain.

## 2016-04-19 NOTE — ED Provider Notes (Addendum)
[facility] Emergency Department Provider Note   ____________________________________________   First MD Initiated Contact with Patient 04/19/16 [phone]     (approximate)  I have reviewed the triage vital signs and the nursing notes.   HISTORY  Chief Complaint Urticaria and Emesis    HPI Kristina Welch is a 25 y.o. female  hiradenitise superativa. Patient has frequent abscesses in her axilla. Patient usually soaks them and with warm soaks and the drain spontaneously and she gets better. Sometimes she reports she gets hives on the upper part of her arm medial humerus with these abscesses when they drained. That's what happened today. She often says she also gets abdominal pain and this happens but today she now I got some abdominal pain but vomited. She is fine now after taking Benadryl. He does have an abscess in the left axilla which looks like it's about to drain. Patient she said she would be quite happy to go back home and let it drain with soaks as she usually does. She is afebrile and not tachycardic and her blood pressure is now stable. I will honor her wishes.  Past Medical History:  Diagnosis Date  . History of iron deficiency anemia     Patient Active Problem List   Diagnosis Date Noted  . DUB (dysfunctional uterine bleeding) 08/29/2015  . H/O iron deficiency anemia 08/29/2015  . Hidradenitis 08/29/2015    Past Surgical History:  Procedure Laterality Date  . CESAREAN SECTION    . cyst removed    . TONSILLECTOMY      Prior to Admission medications   Medication Sig Start Date End Date Taking? Authorizing Provider  brompheniramine-pseudoephedrine-DM 30-2-10 MG/5ML syrup Take 10 mLs by mouth 4 (four) times daily as needed. 10/02/15   Jami L Hagler, PA-C  doxycycline (VIBRA-TABS) 100 MG tablet Take 1 tablet (100 mg total) by mouth 2 (two) times daily. 10/03/15   Anola Gurney, PA  EPINEPHrine 0.3 mg/0.3 mL IJ SOAJ injection Inject 0.3  mLs (0.3 mg total) into the muscle once. 01/21/16   Irean Hong, MD  famotidine (PEPCID) 20 MG tablet Take 1 tablet (20 mg total) by mouth 2 (two) times daily. 01/21/16   Irean Hong, MD  ferrous sulfate 325 (65 FE) MG tablet Take 325 mg by mouth daily with breakfast.    Historical Provider, MD  fluticasone (FLONASE) 50 MCG/ACT nasal spray Place 2 sprays into both nostrils daily. 10/02/15   Jami L Hagler, PA-C  hydrOXYzine (ATARAX/VISTARIL) 25 MG tablet Take 1 tablet (25 mg total) by mouth every 4 (four) hours as needed for itching. 10/30/15   Irean Hong, MD  predniSONE (DELTASONE) 20 MG tablet 3 tablets daily x 4 days 02/25/16   Jennye Moccasin, MD    Allergies Peanut-containing drug products and Zyrtec [cetirizine]  Family History  Problem Relation Age of Onset  . Cancer Mother     remission  . Hypertension Father   . Healthy Daughter   . Healthy Son   . Cancer Maternal Grandmother     breast    Social History Social History  Substance Use Topics  . Smoking status: Never Smoker  . Smokeless tobacco: Never Used  . Alcohol use No    Review of Systems Constitutional: No fever/chills Eyes: No visual changes. ENT: No sore throat. Cardiovascular: Denies chest pain. Respiratory: Denies shortness of breath. Gastrointestinal: No abdominal pain.  No nausea, no vomiting.  No diarrhea.  No constipation. Genitourinary: Negative for  dysuria. Musculoskeletal: Negative for back pain. Skin: Negative for rash. Neurological: Negative for headaches, focal weakness or numbness.  10-point ROS otherwise negative.  ____________________________________________   PHYSICAL EXAM:  VITAL SIGNS: ED Triage Vitals [04/19/16 0136]  Enc Vitals Group     BP 119/62     Pulse Rate (!) 134     Resp 20     Temp 98.5 F (36.9 C)     Temp Source Oral     SpO2 98 %     Weight 195 lb (88.5 kg)     Height 5\' 3"  (1.6 m)     Head Circumference      Peak Flow      Pain Score 0     Pain Loc      Pain  Edu?      Excl. in GC?      Constitutional: Alert and oriented. Well appearing and in no acute distress. Eyes: Conjunctivae are normal. PERRL. EOMI. Head: Atraumatic. Nose: No congestion/rhinnorhea. Mouth/Throat: Mucous membranes are moist.  Oropharynx non-erythematous. Neck: No stridor. Cardiovascular: Normal rate, regular rhythm. Grossly normal heart sounds.  Good peripheral circulation. Respiratory: Normal respiratory effort.  No retractions. Lungs CTAB. Gastrointestinal: Soft and nontender. No distention. No abdominal bruits. No CVA tenderness. Musculoskeletal: No lower extremity tenderness nor edema.  No joint effusions. Neurologic:  Normal speech and language. No gross focal neurologic deficits are appreciated. No gait instability. Skin:  Skin is warm, dry and intact. No rash noted. Psychiatric: Mood and affect are normal. Speech and behavior are normal.  ____________________________________________   LABS (all labs ordered are listed, but only abnormal results are displayed)  Labs Reviewed  COMPREHENSIVE METABOLIC PANEL - Abnormal; Notable for the following:       Result Value   Potassium 3.1 (*)    Glucose, Bld 139 (*)    ALT 11 (*)    All other components within normal limits  CBC  LIPASE, BLOOD   ____________________________________________  EKG   ____________________________________________  RADIOLOGY   ____________________________________________   PROCEDURES  Procedure(s) performed:Abscess cleaned with Betadine and numbed up with lidocaine with epi small hole opened in the end of the abscess which was then expanded with #11 blade to be about 1 cm pus was expressed wound was irrigated with Betadine and saline mixture and then packed with Nu Gauze. Patient tolerated very well. We'll give the patient a prescription of Percocet 11/21/2023 one 4 times a day when necessary #10 with instructions to make sure to take 1 when she returns in 2 days to get the wound  repacked. She can also have that done at her regular doctor's office.  Procedures  Critical Care performed:   ____________________________________________   INITIAL IMPRESSION / ASSESSMENT AND PLAN / ED COURSE  Pertinent labs & imaging results that were available during my care of the patient were reviewed by me and considered in my medical decision making (see chart for details).    Clinical Course   Unfortunately I cannot make the instructions reflux what I dictated above.  ____________________________________________   FINAL CLINICAL IMPRESSION(S) / ED DIAGNOSES  Final diagnoses:  Hives  Other diagnosis is I&D of abscess  Other diagnosis abscess I&D of abscess NEW MEDICATIONS STARTED DURING THIS VISIT:  Discharge Medication List as of 04/19/2016  5:31 AM       Note:  This document was prepared using Dragon voice recognition software and may include unintentional dictation errors.    Arnaldo NatalPaul F Hadlea Furuya, MD 04/19/16 0530  Arnaldo Natal, MD 04/19/16 [phone]

## 2016-04-19 NOTE — Discharge Instructions (Signed)
Please continue to follow your usual care path. Soak the abscess that should drain soon it looks like it's coming to head. Please [facility] or [facility] at the number I gave you. There are some other treatments for this that may work and may keep you from having to have recurrent abscesses like this. Please remember not to shave under your arms and do not use deodorant. You can use alcohol to swallow your underarms 2 or 3 times a day. Lee's return for any further problems especially worse belly pain and feeling lightheaded developing a fever hives especially if they're not localized to the area where they usually occur.

## 2016-04-22 ENCOUNTER — Encounter: Payer: Self-pay | Admitting: *Deleted

## 2016-04-22 ENCOUNTER — Emergency Department
Admission: EM | Admit: 2016-04-22 | Discharge: 2016-04-22 | Disposition: A | Discharge status: Home / Self Care | Attending: Emergency Medicine | Admitting: Emergency Medicine

## 2016-04-22 DIAGNOSIS — Z4800 Encounter for change or removal of nonsurgical wound dressing: Secondary | ICD-10-CM | POA: Diagnosis not present

## 2016-04-22 DIAGNOSIS — L732 Hidradenitis suppurativa: Secondary | ICD-10-CM | POA: Insufficient documentation

## 2016-04-22 DIAGNOSIS — Z792 Long term (current) use of antibiotics: Secondary | ICD-10-CM | POA: Diagnosis not present

## 2016-04-22 DIAGNOSIS — Z79899 Other long term (current) drug therapy: Secondary | ICD-10-CM | POA: Insufficient documentation

## 2016-04-22 DIAGNOSIS — Z7951 Long term (current) use of inhaled steroids: Secondary | ICD-10-CM | POA: Insufficient documentation

## 2016-04-22 DIAGNOSIS — Z5189 Encounter for other specified aftercare: Secondary | ICD-10-CM

## 2016-04-22 NOTE — ED Notes (Signed)

## 2016-04-22 NOTE — ED Triage Notes (Signed)
Pt here for wound check of left axilla.  Pt reports she needs packing from abscess removed.

## 2016-04-22 NOTE — ED Notes (Signed)
See triage note  States she had abscess lanced and here for packing removal

## 2016-04-22 NOTE — ED Provider Notes (Signed)
[facility] Emergency Department Provider Note  ____________________________________________   None    (approximate)  I have reviewed the triage vital signs and the nursing notes.   HISTORY  Chief Complaint Wound Check   HPI Kristina Welch is a 25 y.o. female who presents for wound re-check after I&D of left axilla abscess on 9/30. Patient has not changed dressing since 9/30. Was not placed on antibiotics at time of procedure. Has a history of hidradenitis, but usually manages at home. Denies fevers, chest pain, SOB, pain at wound site. Wound has drained some since 9/30, but not much.    Past Medical History:  Diagnosis Date  . History of iron deficiency anemia     Patient Active Problem List   Diagnosis Date Noted  . DUB (dysfunctional uterine bleeding) 08/29/2015  . H/O iron deficiency anemia 08/29/2015  . Hidradenitis 08/29/2015    Past Surgical History:  Procedure Laterality Date  . CESAREAN SECTION    . cyst removed    . TONSILLECTOMY      Prior to Admission medications   Medication Sig Start Date End Date Taking? Authorizing Provider  brompheniramine-pseudoephedrine-DM 30-2-10 MG/5ML syrup Take 10 mLs by mouth 4 (four) times daily as needed. 10/02/15   Jami L Hagler, PA-C  doxycycline (VIBRA-TABS) 100 MG tablet Take 1 tablet (100 mg total) by mouth 2 (two) times daily. 10/03/15   Anola Gurney, PA  EPINEPHrine 0.3 mg/0.3 mL IJ SOAJ injection Inject 0.3 mLs (0.3 mg total) into the muscle once. 01/21/16   Irean Hong, MD  famotidine (PEPCID) 20 MG tablet Take 1 tablet (20 mg total) by mouth 2 (two) times daily. 01/21/16   Irean Hong, MD  ferrous sulfate 325 (65 FE) MG tablet Take 325 mg by mouth daily with breakfast.    Historical Provider, MD  fluticasone (FLONASE) 50 MCG/ACT nasal spray Place 2 sprays into both nostrils daily. 10/02/15   Jami L Hagler, PA-C  hydrOXYzine (ATARAX/VISTARIL) 25 MG tablet Take 1 tablet (25 mg total) by  mouth every 4 (four) hours as needed for itching. 10/30/15   Irean Hong, MD  predniSONE (DELTASONE) 20 MG tablet 3 tablets daily x 4 days 02/25/16   Jennye Moccasin, MD    Allergies Peanut-containing drug products and Zyrtec [cetirizine]  Family History  Problem Relation Age of Onset  . Cancer Mother     remission  . Hypertension Father   . Healthy Daughter   . Healthy Son   . Cancer Maternal Grandmother     breast    Social History Social History  Substance Use Topics  . Smoking status: Never Smoker  . Smokeless tobacco: Never Used  . Alcohol use No    Review of Systems Constitutional: No fever/chills Cardiovascular: Denies chest pain. Respiratory: Denies shortness of breath. Musculoskeletal: Negative for left shoulder pain. Skin: Positive for wound with packing in left axilla. Negative for hives or rash.  Neurological: Negative for focal weakness or numbness.  ____________________________________________   PHYSICAL EXAM:  VITAL SIGNS: ED Triage Vitals  Enc Vitals Group     BP 04/22/16 1621 (!) 108/58     Pulse Rate 04/22/16 1618 72     Resp 04/22/16 1618 20     Temp 04/22/16 1618 98.9 F (37.2 C)     Temp Source 04/22/16 1618 Oral     SpO2 04/22/16 1618 100 %     Weight 04/22/16 1620 195 lb (88.5 kg)  Height 04/22/16 1620 5\' 3"  (1.6 m)     Head Circumference --      Peak Flow --      Pain Score 04/22/16 1620 0     Pain Loc --      Pain Edu? --      Excl. in GC? --     Constitutional: Alert and oriented. Well appearing and in no acute distress. Eyes: Conjunctivae are normal.  Head: Atraumatic. Neck: No stridor. Supple, full ROM without pain or difficulty.  Cardiovascular: Peripheral pulses 2+.  Respiratory: Normal respiratory effort.  No retractions.  Musculoskeletal: Full ROM in all extremities without pain or difficulty.  Neurologic:  Normal speech and language. No gross focal neurologic deficits are appreciated. No gait instability. Skin:  Skin  is warm, dry. Wound packing removed and tissue is pink and well healing, no necrotic tissue or pus noted, no induration surrounding erythema. No rash noted.  Psychiatric: Mood and affect are normal. Speech and behavior are normal.  ____________________________________________   LABS (all labs ordered are listed, but only abnormal results are displayed)  Labs Reviewed - No data to display ____________________________________________  EKG  None. ____________________________________________  RADIOLOGY  None.  ____________________________________________   PROCEDURES  Procedure(s) performed: None  Procedures  Critical Care performed: No  ____________________________________________   INITIAL IMPRESSION / ASSESSMENT AND PLAN / ED COURSE  Pertinent labs & imaging results that were available during my care of the patient were reviewed by me and considered in my medical decision making (see chart for details).  Wound packing removed and wound appears well healing. Small amount of packing placed back in wound. Patient to follow up here or with PCP for wound re-check in 2 days. No other emergency medicine complaints at this time.   Clinical Course     ____________________________________________   FINAL CLINICAL IMPRESSION(S) / ED DIAGNOSES  Final diagnoses:  Wound check, abscess  Hidradenitis suppurativa of left axilla      NEW MEDICATIONS STARTED DURING THIS VISIT:  Discharge Medication List as of 04/22/2016  5:00 PM       Note:  This document was prepared using Dragon voice recognition software and may include unintentional dictation errors.   Evangeline Dakinharles M Beers, PA-C 04/23/16 1024    Arnaldo NatalPaul F Malinda, MD 04/27/16 [phone]

## 2016-04-24 ENCOUNTER — Encounter: Payer: Self-pay | Admitting: Surgery

## 2016-04-24 ENCOUNTER — Ambulatory Visit (INDEPENDENT_AMBULATORY_CARE_PROVIDER_SITE_OTHER): Admitting: Surgery

## 2016-04-24 VITALS — BP 106/65 | HR 67 | Temp 98.0°F | Ht 63.5 in | Wt 199.0 lb

## 2016-04-24 DIAGNOSIS — L02419 Cutaneous abscess of limb, unspecified: Secondary | ICD-10-CM

## 2016-04-24 DIAGNOSIS — L732 Hidradenitis suppurativa: Secondary | ICD-10-CM | POA: Diagnosis not present

## 2016-04-24 NOTE — Progress Notes (Signed)
Kristina Welch is an 25 y.o. female.   Chief Complaint: Arm pit abscess HPI: This patient status post I&D of an arm pit abscess apparently done in the ED she was seen for follow-up wound check and sent here for routine care. She denies fevers or chills and has no pain and she was given Percocet but has not taken any. She was never given antibiotics is not currently on antibiotics  Past Medical History:  Diagnosis Date  . History of iron deficiency anemia     Past Surgical History:  Procedure Laterality Date  . CESAREAN SECTION    . cyst removed    . TONSILLECTOMY      Family History  Problem Relation Age of Onset  . Cancer Mother     remission  . Hypertension Father   . Healthy Daughter   . Healthy Son   . Cancer Maternal Grandmother     breast   Social History:  reports that she has never smoked. She has never used smokeless tobacco. She reports that she does not drink alcohol or use drugs.  Allergies:  Allergies  Allergen Reactions  . Peanut-Containing Drug Products Anaphylaxis  . Zyrtec [Cetirizine] Other (See Comments)    headache     (Not in a hospital admission)   Review of Systems:   Review of Systems  Constitutional: Negative for chills and fever.  HENT: Negative for hearing loss and tinnitus.   Eyes: Negative.   Respiratory: Negative.   Cardiovascular: Negative.   Gastrointestinal: Negative.   Genitourinary: Negative.   Musculoskeletal: Negative.   Skin: Positive for itching and rash.  Neurological: Negative.  Negative for headaches.  Endo/Heme/Allergies: Negative.   Psychiatric/Behavioral: Negative.     Physical Exam:  Physical Exam  Constitutional: She is oriented to person, place, and time and well-developed, well-nourished, and in no distress. No distress.  HENT:  Head: Normocephalic and atraumatic.  Neurological: She is alert and oriented to person, place, and time.  Skin: Skin is warm. She is not diaphoretic. No erythema.   Bilateral axillary hidradenitis.  On the left is a wound with packing in place a packing is removed minimal fibrinopurulent exudate is noted no erythema nontender  Vitals reviewed.   Blood pressure 106/65, pulse 67, temperature 98 F (36.7 C), temperature source Oral, height 5' 3.5" (1.613 m), weight 199 lb (90.3 kg), last menstrual period 04/16/2016.    No results found for this or any previous visit (from the past 48 hour(s)). No results found.   Assessment/Plan Hidradenitis suppurativa bilaterally. On the left was an abscess is been lanced and packed no further packing is necessary at this point. She has never been placed on antibiotics and at this point I see no need to add antibiotics as she is several days out from initial drainage and there is resolving wound issues with no erythema or sign of invasive infection at this point. She'll follow up on an as-needed basis  Lattie Hawichard E Bladen Umar, MD, FACS

## 2016-04-24 NOTE — Patient Instructions (Signed)
Please call with any questions or concerns.

## 2016-05-06 ENCOUNTER — Encounter: Payer: Self-pay | Admitting: Family Medicine

## 2016-05-13 ENCOUNTER — Emergency Department
Admission: EM | Admit: 2016-05-13 | Discharge: 2016-05-13 | Disposition: A | Discharge status: Home / Self Care | Attending: Emergency Medicine | Admitting: Emergency Medicine

## 2016-05-13 ENCOUNTER — Encounter: Payer: Self-pay | Admitting: Emergency Medicine

## 2016-05-13 DIAGNOSIS — L509 Urticaria, unspecified: Secondary | ICD-10-CM | POA: Insufficient documentation

## 2016-05-13 DIAGNOSIS — T7840XA Allergy, unspecified, initial encounter: Secondary | ICD-10-CM | POA: Diagnosis not present

## 2016-05-13 MED ORDER — PREDNISONE 20 MG PO TABS
60.0000 mg | ORAL_TABLET | Freq: Once | ORAL | Status: AC
  Administered 2016-05-13: 60 mg via ORAL
  Filled 2016-05-13: qty 3

## 2016-05-13 MED ORDER — PREDNISONE 20 MG PO TABS: 40.0000 mg | ORAL_TABLET | Freq: Every day | ORAL | 0 refills | Status: AC

## 2016-05-13 MED ORDER — FAMOTIDINE 20 MG PO TABS
40.0000 mg | ORAL_TABLET | Freq: Once | ORAL | Status: AC
  Administered 2016-05-13: 40 mg via ORAL
  Filled 2016-05-13: qty 2

## 2016-05-13 NOTE — ED Triage Notes (Signed)
Patient ambulatory to triage with steady gait, without difficulty or distress noted; pt reports hives noted this morning with no known cause; took benadryl without relief

## 2016-05-13 NOTE — ED Provider Notes (Signed)
[facility] Emergency Department Provider Note    First MD Initiated Contact with Patient 05/13/16 [phone]     (approximate)  I have reviewed the triage vital signs and the nursing notes.   HISTORY  Chief Complaint Allergic Reaction    HPI Kristina Welch is a 25 y.o. female presents to the emergency department with generalized hives which started approximately 1 hour before presentation for which the patient states that she took Benadryl. Patient states she had lower lip swelling abnormal sensation in her throat and tongue which have all since resolved since taking Benadryl. Patient denies any difficulty breathing or swallowing.   Past Medical History:  Diagnosis Date  . History of iron deficiency anemia     Patient Active Problem List   Diagnosis Date Noted  . DUB (dysfunctional uterine bleeding) 08/29/2015  . H/O iron deficiency anemia 08/29/2015  . Hidradenitis 08/29/2015    Past Surgical History:  Procedure Laterality Date  . CESAREAN SECTION    . cyst removed    . TONSILLECTOMY      Prior to Admission medications   Medication Sig Start Date End Date Taking? Authorizing Provider  brompheniramine-pseudoephedrine-DM 30-2-10 MG/5ML syrup Take 10 mLs by mouth 4 (four) times daily as needed. Patient not taking: Reported on 04/24/2016 10/02/15   Jami L Hagler, PA-C  doxycycline (VIBRA-TABS) 100 MG tablet Take 1 tablet (100 mg total) by mouth 2 (two) times daily. Patient not taking: Reported on 04/24/2016 10/03/15   Anola Gurney, PA  EPINEPHrine 0.3 mg/0.3 mL IJ SOAJ injection Inject 0.3 mLs (0.3 mg total) into the muscle once. Patient not taking: Reported on 04/24/2016 01/21/16   Irean Hong, MD  famotidine (PEPCID) 20 MG tablet Take 1 tablet (20 mg total) by mouth 2 (two) times daily. Patient not taking: Reported on 04/24/2016 01/21/16   Irean Hong, MD  ferrous sulfate 325 (65 FE) MG tablet Take 325 mg by mouth daily with breakfast.     Historical Provider, MD  fluticasone (FLONASE) 50 MCG/ACT nasal spray Place 2 sprays into both nostrils daily. Patient not taking: Reported on 04/24/2016 10/02/15   Jami L Hagler, PA-C  hydrOXYzine (ATARAX/VISTARIL) 25 MG tablet Take 1 tablet (25 mg total) by mouth every 4 (four) hours as needed for itching. Patient not taking: Reported on 04/24/2016 10/30/15   Irean Hong, MD  loratadine (CLARITIN) 10 MG tablet Take 10 mg by mouth daily as needed for allergies.    Historical Provider, MD  predniSONE (DELTASONE) 20 MG tablet 3 tablets daily x 4 days Patient not taking: Reported on 04/24/2016 02/25/16   Jennye Moccasin, MD    Allergies Peanut-containing drug products and Zyrtec [cetirizine]  Family History  Problem Relation Age of Onset  . Cancer Mother     remission  . Hypertension Father   . Healthy Daughter   . Healthy Son   . Cancer Maternal Grandmother     breast    Social History Social History  Substance Use Topics  . Smoking status: Never Smoker  . Smokeless tobacco: Never Used  . Alcohol use No    Review of Systems Constitutional: No fever/chills Eyes: No visual changes. ENT: No sore throat. Cardiovascular: Denies chest pain. Respiratory: Denies shortness of breath. Gastrointestinal: No abdominal pain.  No nausea, no vomiting.  No diarrhea.  No constipation. Genitourinary: Negative for dysuria. Musculoskeletal: Negative for back pain. Skin: Positive for "hives" Neurological: Negative for headaches, focal weakness or numbness.  10-point ROS  otherwise negative.  ____________________________________________   PHYSICAL EXAM:  VITAL SIGNS: ED Triage Vitals  Enc Vitals Group     BP 05/13/16 0538 116/80     Pulse Rate 05/13/16 0538 89     Resp 05/13/16 0538 18     Temp 05/13/16 0538 98.1 F (36.7 C)     Temp Source 05/13/16 0538 Oral     SpO2 05/13/16 0538 98 %     Weight 05/13/16 0539 199 lb (90.3 kg)     Height 05/13/16 0539 5\' 3"  (1.6 m)     Head  Circumference --      Peak Flow --      Pain Score --      Pain Loc --      Pain Edu? --      Excl. in GC? --     Constitutional: Alert and oriented. Well appearing and in no acute distress. Eyes: Conjunctivae are normal. PERRL. EOMI. Head: Atraumatic. Mouth/Throat: Mucous membranes are moist.  Oropharynx non-erythematous. Cardiovascular: Normal rate, regular rhythm. Good peripheral circulation. Grossly normal heart sounds. Respiratory: Normal respiratory effort.  No retractions. Lungs CTAB. Gastrointestinal: Soft and nontender. No distention.  Musculoskeletal: No lower extremity tenderness nor edema. No gross deformities of extremities. Neurologic:  Normal speech and language. No gross focal neurologic deficits are appreciated.  Skin: Bilateral thigh hives noted Psychiatric: Mood and affect are normal. Speech and behavior are normal.      Procedures   INITIAL IMPRESSION / ASSESSMENT AND PLAN / ED COURSE  Pertinent labs & imaging results that were available during my care of the patient were reviewed by me and considered in my medical decision making (see chart for details).  Patient received prednisone and Pepcid in the emergency department will be prescribed prednisone for home   Clinical Course    ____________________________________________  FINAL CLINICAL IMPRESSION(S) / ED DIAGNOSES  Final diagnoses:  Allergic reaction, initial encounter     MEDICATIONS GIVEN DURING THIS VISIT:  Medications  predniSONE (DELTASONE) tablet 60 mg (60 mg Oral Given 05/13/16 0553)  famotidine (PEPCID) tablet 40 mg (40 mg Oral Given 05/13/16 0553)     NEW OUTPATIENT MEDICATIONS STARTED DURING THIS VISIT:  New Prescriptions   No medications on file    Modified Medications   No medications on file    Discontinued Medications   No medications on file     Note:  This document was prepared using Dragon voice recognition software and may include unintentional dictation  errors.    Darci Currentandolph N Brown, MD 05/13/16 [phone]

## 2016-05-21 ENCOUNTER — Ambulatory Visit (INDEPENDENT_AMBULATORY_CARE_PROVIDER_SITE_OTHER): Admitting: Family Medicine

## 2016-05-21 ENCOUNTER — Ambulatory Visit: Admitting: Family Medicine

## 2016-05-21 ENCOUNTER — Encounter: Payer: Self-pay | Admitting: Family Medicine

## 2016-05-21 VITALS — BP 106/84 | HR 72 | Temp 97.8°F | Resp 16 | Wt 198.0 lb

## 2016-05-21 DIAGNOSIS — B9789 Other viral agents as the cause of diseases classified elsewhere: Secondary | ICD-10-CM | POA: Diagnosis not present

## 2016-05-21 DIAGNOSIS — J069 Acute upper respiratory infection, unspecified: Secondary | ICD-10-CM

## 2016-05-21 MED ORDER — HYDROCODONE-HOMATROPINE 5-1.5 MG/5ML PO SYRP: ORAL_SOLUTION | ORAL | 0 refills | Status: DC

## 2016-05-21 NOTE — Patient Instructions (Signed)
Discussed use of Mucinex D for congestion, Delsym for cough, and Benadryl for postnasal drainage 

## 2016-05-21 NOTE — Progress Notes (Signed)
Subjective:     Patient ID: Netty StarringCallie Michael Torain, female   DOB: 10/22/1990, 25 y.o.   MRN: [id number]  HPI  Chief Complaint  Patient presents with  . Sore Throat    Patient comes in office today with complaints of sore throat and sinus pain and pressure on the left side of her face for the past 4 days. Patient reports pain in the left ear, nasal congestion on left side, productive cough and hoarsness. Patient has been taking otc Benadryl and Claritin  States her husband joined the [employer] and she now has insurance for herself and her 765 & 27 year old children. Has has multiple ER visits for urticaria and axillary hidradenitis. Has allergist and gyn appointments set up. Will be seeing me for a physical later this month.   Review of Systems  Constitutional: Negative for chills and fever.       Objective:   Physical Exam  Constitutional: She appears well-developed and well-nourished. No distress.  Ears: T.M's intact without inflammation Throat: no tonsillar enlargement or exudate Neck: no cervical adenopathy Lungs: clear     Assessment:    1. Viral upper respiratory tract infection - HYDROcodone-homatropine (HYCODAN) 5-1.5 MG/5ML syrup; 5 ml 4-6 hours as needed for cough  Dispense: 240 mL; Refill: 0    Plan:    Discussed use of Mucinex D for congestion, Delsym for cough, and Benadryl for postnasal drainage

## 2016-05-29 ENCOUNTER — Other Ambulatory Visit: Payer: Self-pay | Admitting: Family Medicine

## 2016-05-29 ENCOUNTER — Telehealth: Payer: Self-pay | Admitting: Family Medicine

## 2016-05-29 DIAGNOSIS — J0110 Acute frontal sinusitis, unspecified: Secondary | ICD-10-CM

## 2016-05-29 MED ORDER — AMOXICILLIN-POT CLAVULANATE 875-125 MG PO TABS: 1.0000 | ORAL_TABLET | Freq: Two times a day (BID) | ORAL | 0 refills | Status: DC

## 2016-05-29 NOTE — Telephone Encounter (Signed)
Pt was in last week with cough and congestion. She seen Nadine CountsBob.  He told her it was a virus.  This week the cough is better but she is having sinus pressure now.  A little neck pain, no headache just sinus pressure.. No fever. Experiencing fatigue also.  Please advise. [phone]  Thanks Barth Kirkseri

## 2016-05-29 NOTE — Telephone Encounter (Signed)
Reports increased frontal sinus pressure and purulent PND. Will send in Augmentin to cover for sinus infection.

## 2016-05-30 ENCOUNTER — Ambulatory Visit: Payer: Self-pay | Admitting: Allergy

## 2016-06-05 ENCOUNTER — Telehealth: Payer: Self-pay | Admitting: Family Medicine

## 2016-06-05 ENCOUNTER — Encounter: Payer: Self-pay | Admitting: Family Medicine

## 2016-06-05 ENCOUNTER — Other Ambulatory Visit: Payer: Self-pay | Admitting: Family Medicine

## 2016-06-05 ENCOUNTER — Ambulatory Visit (INDEPENDENT_AMBULATORY_CARE_PROVIDER_SITE_OTHER): Admitting: Family Medicine

## 2016-06-05 VITALS — BP 122/84 | HR 76 | Temp 98.2°F | Resp 16 | Ht 63.0 in | Wt 198.0 lb

## 2016-06-05 DIAGNOSIS — L509 Urticaria, unspecified: Secondary | ICD-10-CM | POA: Diagnosis not present

## 2016-06-05 DIAGNOSIS — Z0000 Encounter for general adult medical examination without abnormal findings: Secondary | ICD-10-CM

## 2016-06-05 DIAGNOSIS — Z862 Personal history of diseases of the blood and blood-forming organs and certain disorders involving the immune mechanism: Secondary | ICD-10-CM

## 2016-06-05 DIAGNOSIS — N938 Other specified abnormal uterine and vaginal bleeding: Secondary | ICD-10-CM | POA: Diagnosis not present

## 2016-06-05 DIAGNOSIS — L732 Hidradenitis suppurativa: Secondary | ICD-10-CM | POA: Diagnosis not present

## 2016-06-05 DIAGNOSIS — Z87892 Personal history of anaphylaxis: Secondary | ICD-10-CM

## 2016-06-05 MED ORDER — EPINEPHRINE 0.3 MG/0.3ML IJ SOAJ: 0.3000 mg | Freq: Once | INTRAMUSCULAR | 1 refills | Status: DC

## 2016-06-05 NOTE — Telephone Encounter (Signed)
Please review. KW 

## 2016-06-05 NOTE — Progress Notes (Addendum)
Subjective:     Patient ID: Kristina Welch, female   DOB: 02/19/1991, 25 y.o.   MRN: [id number]  HPI  Chief Complaint  Patient presents with  . Annual Exam    Pt comes in for a physical exam. Pt feels well, is exercising 1-2 times a week, and sleeps fairly well.  Has had [facility] for dysfunctional bleeding and has f/u planned for probable Implanon insertion. Has appointment pending with allergist regarding recurrent urticaria. Wishes further intervention regarding her axillary hidradenitis.   Review of Systems General: Feeling well; defers flu vaccine HEENT: regular dental visits and recent eye exam Cardiovascular: no chest pain, shortness of breath, or palpitations GI: no heartburn, no change in bowel habits GU:  no change in bladder habits. Seeing gyn due to DUB and accompanying anemia Psychiatric: not depressed Musculoskeletal: no joint pain    Objective:   Physical Exam  Constitutional: She appears well-developed and well-nourished. No distress.  Eyes: PERRLA Neck: no thyromegaly, tenderness or nodules, no cervical adenopathy ENT: TM's intact without inflammation;tonsils absent Lungs: Clear Heart : RRR without murmur or gallop Abd: bowel sounds present, soft, non-tender, no organomegaly Extremities: no edema,       Assessment:    1. Annual physical exam - Comprehensive metabolic panel - Lipid panel  2. DUB (dysfunctional uterine bleeding): per gyn  3. H/O iron deficiency anemia - CBC with Differential/Platelet - Ferritin  4. Hidradenitis - Ambulatory referral to Dermatology  5. Urticaria: allergist referral pending    Plan:    Further f/u pending lab work. Encouraged walking 30 minutes daily.

## 2016-06-05 NOTE — Patient Instructions (Addendum)
We will call you with the lab results and dermatology referral. Do follow up with gyn for pap and breast exam. Encourage walking 30 minutes daily.

## 2016-06-05 NOTE — Telephone Encounter (Signed)
Epinephrine refilled pending allergist evaluation.

## 2016-06-05 NOTE — Telephone Encounter (Signed)
Pt stated that when she went to ED on 01/21/16 she was given a Rx for EPINEPHrine 0.3 mg/0.3 mL IJ SOAJ injection but didn't get it filled but she didn't have insurance and couldn't afford the cost. Pt stated she doesn't have the script and would [facility]. Please advise. Thanks TNP

## 2016-06-06 ENCOUNTER — Telehealth: Payer: Self-pay

## 2016-06-06 ENCOUNTER — Telehealth: Payer: Self-pay | Admitting: Family Medicine

## 2016-06-06 ENCOUNTER — Emergency Department
Admission: EM | Admit: 2016-06-06 | Discharge: 2016-06-06 | Disposition: A | Discharge status: Home / Self Care | Attending: Emergency Medicine | Admitting: Emergency Medicine

## 2016-06-06 ENCOUNTER — Encounter: Payer: Self-pay | Admitting: Medical Oncology

## 2016-06-06 DIAGNOSIS — Y92410 Unspecified street and highway as the place of occurrence of the external cause: Secondary | ICD-10-CM | POA: Insufficient documentation

## 2016-06-06 DIAGNOSIS — S20211A Contusion of right front wall of thorax, initial encounter: Secondary | ICD-10-CM | POA: Diagnosis not present

## 2016-06-06 DIAGNOSIS — Z91010 Allergy to peanuts: Secondary | ICD-10-CM | POA: Diagnosis not present

## 2016-06-06 DIAGNOSIS — Y999 Unspecified external cause status: Secondary | ICD-10-CM | POA: Insufficient documentation

## 2016-06-06 DIAGNOSIS — S299XXA Unspecified injury of thorax, initial encounter: Secondary | ICD-10-CM | POA: Diagnosis present

## 2016-06-06 DIAGNOSIS — Y939 Activity, unspecified: Secondary | ICD-10-CM | POA: Diagnosis not present

## 2016-06-06 DIAGNOSIS — M7918 Myalgia, other site: Secondary | ICD-10-CM

## 2016-06-06 LAB — CBC WITH DIFFERENTIAL/PLATELET
BASOS ABS: 0 10*3/uL (ref 0.0–0.2)
BASOS: 1 %
EOS (ABSOLUTE): 0.1 10*3/uL (ref 0.0–0.4)
Eos: 3 %
Hematocrit: 32.1 % — ABNORMAL LOW (ref 34.0–46.6)
Hemoglobin: 10.2 g/dL — ABNORMAL LOW (ref 11.1–15.9)
Immature Grans (Abs): 0 10*3/uL (ref 0.0–0.1)
Immature Granulocytes: 0 %
LYMPHS: 44 %
Lymphocytes Absolute: 1.7 10*3/uL (ref 0.7–3.1)
MCH: 28.3 pg (ref 26.6–33.0)
MCHC: 31.8 g/dL (ref 31.5–35.7)
MCV: 89 fL (ref 79–97)
MONOS ABS: 0.4 10*3/uL (ref 0.1–0.9)
Monocytes: 10 %
NEUTROS ABS: 1.6 10*3/uL (ref 1.4–7.0)
Neutrophils: 42 %
PLATELETS: 336 10*3/uL (ref 150–379)
RBC: 3.61 x10E6/uL — ABNORMAL LOW (ref 3.77–5.28)
RDW: 15.5 % — AB (ref 12.3–15.4)
WBC: 3.9 10*3/uL (ref 3.4–10.8)

## 2016-06-06 LAB — COMPREHENSIVE METABOLIC PANEL
A/G RATIO: 1.3 (ref 1.2–2.2)
ALT: 9 IU/L (ref 0–32)
AST: 14 IU/L (ref 0–40)
Albumin: 4.1 g/dL (ref 3.5–5.5)
Alkaline Phosphatase: 57 IU/L (ref 39–117)
BILIRUBIN TOTAL: 0.3 mg/dL (ref 0.0–1.2)
BUN/Creatinine Ratio: 19 (ref 9–23)
BUN: 13 mg/dL (ref 6–20)
CALCIUM: 9.1 mg/dL (ref 8.7–10.2)
CHLORIDE: 103 mmol/L (ref 96–106)
CO2: 21 mmol/L (ref 18–29)
Creatinine, Ser: 0.7 mg/dL (ref 0.57–1.00)
GFR, EST AFRICAN AMERICAN: 137 mL/min/{1.73_m2} (ref >59)
GFR, EST NON AFRICAN AMERICAN: 119 mL/min/{1.73_m2} (ref >59)
GLOBULIN, TOTAL: 3.1 g/dL (ref 1.5–4.5)
Glucose: 84 mg/dL (ref 65–99)
POTASSIUM: 4.4 mmol/L (ref 3.5–5.2)
SODIUM: 138 mmol/L (ref 134–144)
TOTAL PROTEIN: 7.2 g/dL (ref 6.0–8.5)

## 2016-06-06 LAB — LIPID PANEL
CHOL/HDL RATIO: 3.5 ratio (ref 0.0–4.4)
Cholesterol, Total: 140 mg/dL (ref 100–199)
HDL: 40 mg/dL (ref >39)
LDL Calculated: 91 mg/dL (ref 0–99)
Triglycerides: 46 mg/dL (ref 0–149)
VLDL Cholesterol Cal: 9 mg/dL (ref 5–40)

## 2016-06-06 LAB — FERRITIN: FERRITIN: 13 ng/mL — AB (ref 15–150)

## 2016-06-06 MED ORDER — TRAMADOL HCL 50 MG PO TABS: 50.0000 mg | ORAL_TABLET | Freq: Four times a day (QID) | ORAL | 0 refills | Status: DC | PRN

## 2016-06-06 MED ORDER — CYCLOBENZAPRINE HCL 10 MG PO TABS: 10.0000 mg | ORAL_TABLET | Freq: Three times a day (TID) | ORAL | 0 refills | Status: DC | PRN

## 2016-06-06 MED ORDER — IBUPROFEN 600 MG PO TABS: 600.0000 mg | ORAL_TABLET | Freq: Three times a day (TID) | ORAL | 0 refills | Status: DC | PRN
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
## 2016-06-06 NOTE — Telephone Encounter (Signed)
Pt called for lab results.  Labs were drawn yesterday,  Call [phone]
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
## 2016-06-06 NOTE — Telephone Encounter (Signed)
Patient advised.KW 

## 2016-06-06 NOTE — Telephone Encounter (Signed)
Patient was advised. KW 

## 2016-06-06 NOTE — Telephone Encounter (Signed)
-----   Message from Anola Gurneyobert Chauvin, [place] sent at 06/06/2016  7:35 AM EST ----- Your anemia/low iron is present so would start 300-325 mg. Iron sulfate once or twice daily. Recheck labs in 2-3 months. If your menstrual periods are better controlled per gyn the anemia should resolve.

## 2016-06-06 NOTE — ED Notes (Signed)
Bruising noted to right breast where pt reports having pain with palpation and feeling as though "someone has bitten me"

## 2016-06-16 ENCOUNTER — Ambulatory Visit (INDEPENDENT_AMBULATORY_CARE_PROVIDER_SITE_OTHER): Admitting: Family Medicine

## 2016-06-16 ENCOUNTER — Encounter: Payer: Self-pay | Admitting: Family Medicine

## 2016-06-16 VITALS — BP 118/82 | HR 80 | Temp 98.0°F | Resp 16 | Wt 201.0 lb

## 2016-06-16 DIAGNOSIS — S20211D Contusion of right front wall of thorax, subsequent encounter: Secondary | ICD-10-CM
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
## 2016-06-16 NOTE — Patient Instructions (Addendum)
Discussed application of warm compresses to bruising. Do f/u with gyn as scheduled.

## 2016-06-20 ENCOUNTER — Telehealth: Payer: Self-pay | Admitting: Family Medicine

## 2016-06-20 NOTE — Telephone Encounter (Signed)
Pt stated that she got an alert that she is due for her Tetanus shot. Pt wanted to make sure she is due for that vaccine and she if it is urgent that she get it. Please advise. Thanks TNP

## 2016-06-20 NOTE — Telephone Encounter (Signed)
Lmtcb, the only record I see is Td in 2005-aa

## 2016-06-20 NOTE — Telephone Encounter (Signed)
Pt advised and appointment made to get Tdap-aa

## 2016-06-24 ENCOUNTER — Ambulatory Visit (INDEPENDENT_AMBULATORY_CARE_PROVIDER_SITE_OTHER): Admitting: Family Medicine

## 2016-06-24 ENCOUNTER — Encounter: Payer: Self-pay | Admitting: Family Medicine

## 2016-06-24 VITALS — HR 92 | Temp 98.1°F | Wt 200.6 lb

## 2016-06-24 DIAGNOSIS — B9789 Other viral agents as the cause of diseases classified elsewhere: Secondary | ICD-10-CM | POA: Diagnosis not present

## 2016-06-24 DIAGNOSIS — Z23 Encounter for immunization: Secondary | ICD-10-CM

## 2016-06-24 DIAGNOSIS — J069 Acute upper respiratory infection, unspecified: Secondary | ICD-10-CM

## 2016-06-24 NOTE — Progress Notes (Signed)
Subjective:     Patient ID: Netty StarringCallie Michael Kerrigan, female   DOB: 06/03/1991, 25 y.o.   MRN: [id number]  HPI  Chief Complaint  Patient presents with  . Nasal Congestion    Patient comes in office today with complaints of nasal congestion for 1 week and dry cough and burning in chest for the past 48hrs.   . Immunizations    Patient is here to update her TDap vaccine, she is due for flu today but has declined     Review of Systems     Objective:   Physical Exam  Constitutional: She appears well-developed and well-nourished. No distress.  Ears: T.M's intact without inflammation Throat: Tonsils absent, no erythema Neck: no cervical adenopathy Lungs: clear     Assessment:    1. Viral upper respiratory tract infection  2. Need for diphtheria-tetanus-pertussis (Tdap) vaccine - Tdap vaccine greater than or equal to 7yo IM    Plan:    Discussed use of Mucinex D for congestion, Delsym for cough, and Benadryl for postnasal drainage

## 2016-06-24 NOTE — Patient Instructions (Signed)
Discussed use of Mucinex D for congestion, Delsym for cough, and Benadryl for postnasal drainage 

## 2017-05-12 ENCOUNTER — Encounter: Payer: Self-pay | Admitting: Emergency Medicine

## 2017-05-12 ENCOUNTER — Emergency Department
Admission: EM | Admit: 2017-05-12 | Discharge: 2017-05-12 | Disposition: A | Discharge status: Home / Self Care | Attending: Emergency Medicine | Admitting: Emergency Medicine

## 2017-05-12 DIAGNOSIS — Z3A00 Weeks of gestation of pregnancy not specified: Secondary | ICD-10-CM | POA: Diagnosis not present

## 2017-05-12 DIAGNOSIS — O9989 Other specified diseases and conditions complicating pregnancy, childbirth and the puerperium: Secondary | ICD-10-CM | POA: Insufficient documentation

## 2017-05-12 DIAGNOSIS — Z91010 Allergy to peanuts: Secondary | ICD-10-CM | POA: Insufficient documentation

## 2017-05-12 DIAGNOSIS — Z79899 Other long term (current) drug therapy: Secondary | ICD-10-CM | POA: Diagnosis not present

## 2017-05-12 DIAGNOSIS — O039 Complete or unspecified spontaneous abortion without complication: Secondary | ICD-10-CM | POA: Diagnosis not present

## 2017-05-12 DIAGNOSIS — O209 Hemorrhage in early pregnancy, unspecified: Secondary | ICD-10-CM | POA: Diagnosis present

## 2017-05-12 LAB — HEMOGLOBIN AND HEMATOCRIT, BLOOD
HCT: 30.1 % — ABNORMAL LOW (ref 35.0–47.0)
Hemoglobin: 10 g/dL — ABNORMAL LOW (ref 12.0–16.0)

## 2017-05-12 LAB — HCG, QUANTITATIVE, PREGNANCY: HCG, BETA CHAIN, QUANT, S: 123 m[IU]/mL — AB (ref <5)

## 2017-05-12 MED ORDER — IBUPROFEN 400 MG PO TABS
600.0000 mg | ORAL_TABLET | ORAL | Status: AC
  Administered 2017-05-12: 600 mg via ORAL

## 2017-05-12 MED ORDER — IBUPROFEN 400 MG PO TABS
ORAL_TABLET | ORAL | Status: AC
  Filled 2017-05-12: qty 2

## 2017-05-12 NOTE — ED Provider Notes (Signed)
[facility] Emergency Department Provider Note   ____________________________________________   First MD Initiated Contact with Patient 05/12/17 1410     (approximate)  I have reviewed the triage vital signs and the nursing notes.   HISTORY  Chief Complaint Vaginal Bleeding    HPI Kristina Welch is a 26 y.o. female here for reevaluation of a blood pregnancy test  Patient reports she was seen at [facility] last night, she began experiencing menstrual-like cramps and bleeding yesterday and noted what looked like a small amount of grayish clot with it which she was suspicious could have been related to a possible miscarriage since she was not anticipating seeing any sort of grayish clot come out.  She then took a pregnancy test was positive, was referred to [facility] by an urgent care  She went to [facility] last night and had an ultrasound, blood work, and was observed there and left because of childcare issues  She reports since leaving [facility], her pain is gone away, she is only used 1 pad today and her bleeding and discomfort has essentially gone away.  No nausea or vomiting.  No fevers or chills.  No lightheadedness weakness or shortness of breath.  No heavy bleeding.  Denies any ongoing pain except for some very minimal achiness which she says is essentially going away.  Visiting from [place], planning to return there on Wednesday of this week.  On no fertility treatments.  No previous history of ectopic.  Past Medical History:  Diagnosis Date  . History of iron deficiency anemia     Patient Active Problem List   Diagnosis Date Noted  . Urticaria 06/05/2016  . DUB (dysfunctional uterine bleeding) 08/29/2015  . H/O iron deficiency anemia 08/29/2015  . Hidradenitis 08/29/2015    Past Surgical History:  Procedure Laterality Date  . CESAREAN SECTION    . cyst removed    . TONSILLECTOMY      Prior to Admission medications     Medication Sig Start Date End Date Taking? Authorizing Provider  diphenhydrAMINE (BENADRYL) 25 MG tablet Take 25 mg by mouth every 6 (six) hours as needed for itching.    [provider]  EPINEPHrine 0.3 mg/0.3 mL IJ SOAJ injection Inject 0.3 mLs (0.3 mg total) into the muscle once. 06/05/16 06/05/16  Anola Gurney, PA  ferrous sulfate 325 (65 FE) MG EC tablet Take 325 mg by mouth daily at 2 PM.    [provider]  loratadine (CLARITIN) 10 MG tablet Take 10 mg by mouth daily as needed for allergies.    [provider]    Allergies Peanut-containing drug products and Zyrtec [cetirizine]  Family History  Problem Relation Age of Onset  . Cancer Mother        remission  . Hypertension Father   . Healthy Daughter   . Healthy Son   . Cancer Maternal Grandmother        breast    Social History Social History  Substance Use Topics  . Smoking status: Never Smoker  . Smokeless tobacco: Never Used  . Alcohol use No    Review of Systems Constitutional: No fever/chills Eyes: No visual changes. ENT: No sore throat. Cardiovascular: Denies chest pain. Respiratory: Denies shortness of breath. Gastrointestinal: No abdominal pain, reports only minimal aching crampy discomfort which she reports is much better than yesterday and is essentially gone away.  No nausea, no vomiting.  No diarrhea.  No constipation. Genitourinary: Negative for dysuria. Musculoskeletal: Negative  for back pain. Skin: Negative for rash. Neurological: Negative for headaches, focal weakness or numbness.    ____________________________________________   PHYSICAL EXAM:  VITAL SIGNS: ED Triage Vitals [05/12/17 1221]  Enc Vitals Group     BP 112/69     Pulse Rate 82     Resp 15     Temp 99 F (37.2 C)     Temp Source Oral     SpO2 100 %     Weight 171 lb 9.6 oz (77.8 kg)     Height 5\' 3"  (1.6 m)     Head Circumference      Peak Flow      Pain Score      Pain Loc      Pain  Edu?      Excl. in GC?     Constitutional: Alert and oriented. Well appearing and in no acute distress.  She is extremely pleasant. Eyes: Conjunctivae are normal. Head: Atraumatic. Nose: No congestion/rhinnorhea. Mouth/Throat: Mucous membranes are moist. Neck: No stridor.   Cardiovascular: Normal rate, regular rhythm. Grossly normal heart sounds.  Good peripheral circulation. Respiratory: Normal respiratory effort.  No retractions. Lungs CTAB. Gastrointestinal: Soft and nontender. No distention.  No rebound or guarding.  Very reassuring and normal abdominal exam with no pain reported. Musculoskeletal: No lower extremity tenderness nor edema. Neurologic:  Normal speech and language. No gross focal neurologic deficits are appreciated.  Skin:  Skin is warm, dry and intact. No rash noted. Psychiatric: Mood and affect are normal. Speech and behavior are normal.  ____________________________________________   LABS (all labs ordered are listed, but only abnormal results are displayed)  Labs Reviewed  HCG, QUANTITATIVE, PREGNANCY - Abnormal; Notable for the following:       Result Value   hCG, Beta Chain, Quant, S 123 (*)    All other components within normal limits  HEMOGLOBIN AND HEMATOCRIT, BLOOD - Abnormal; Notable for the following:    Hemoglobin 10.0 (*)    HCT 30.1 (*)    All other components within normal limits   ____________________________________________  EKG   ____________________________________________  RADIOLOGY   ____________________________________________   PROCEDURES  Procedure(s) performed: None  Procedures  Critical Care performed: No  ____________________________________________   INITIAL IMPRESSION / ASSESSMENT AND PLAN / ED COURSE  Pertinent labs & imaging results that were available during my care of the patient were reviewed by me and considered in my medical decision making (see chart for details).  Patient presents for evaluation for  a repeat blood test.  [facility] today's, her hCG is now downtrending.  [facility] and is noted that they observe her for a hemoglobin drop from 10 down to 8-1/2, today her hemoglobin is back to 10 and she denies any symptoms of bleeding except for using one pad earlier today and reports her pain and bleeding are much better than yesterday.  Based on her symptomatology, abdominal exam, reassuring lab work and stability of her hemoglobin do not believe this represents an ectopic pregnancy but rather likely represents a miscarriage.  Her hCG is now falling, her vitals are stable, and she reports her clinical symptoms are resolving.  She had a pelvic exam and lab work done yesterday, and [facility].  Reviewed the [facility] ER visit records  ----------------------------------------- 3:17 PM on 05/12/2017 -----------------------------------------  Return precautions and treatment recommendations and follow-up discussed with the patient who is  agreeable with the plan.  Discussed with her my diagnosis of miscarriages provisional low, she needs to take very careful return precautions seriously which I described to her in detail, and she will be following up with her doctor in [place] as soon as possible, hopefully this week.       ____________________________________________   FINAL CLINICAL IMPRESSION(S) / ED DIAGNOSES  Final diagnoses:  Miscarriage      NEW MEDICATIONS STARTED DURING THIS VISIT:  New Prescriptions   No medications on file     Note:  This document was prepared using Dragon voice recognition software and may include unintentional dictation errors.     Sharyn Creamer, MD 05/12/17 [phone]

## 2017-05-12 NOTE — Discharge Instructions (Signed)
Please follow up closely with obstetrics and gynecology or your primary doctor.  Return to the emergency room if your bleeding worsens, you become weak and dizzy or lightheaded, you have an episode of passing out, develop severe bleeding such as more than 1 soaked pad per hour for more than 3 straight hours, develop abdominal or pelvic pain, fevers chills or other new concerns arise.   

## 2017-05-12 NOTE — ED Triage Notes (Signed)
Patient presents to ED via POV from home. [facility] and was told she was having a miscarriage. [facility]. [facility] told the patient to follow up to have her pregnancy levels rechecked. Patient states, "I didn't have gas money to get there so I came here". Patient reports vaginal bleeding. Patient reports changing pad twice in 24 hours.

## 2018-01-19 ENCOUNTER — Encounter: Payer: Self-pay | Admitting: Family Medicine

## 2018-01-19 ENCOUNTER — Telehealth: Payer: Self-pay | Admitting: Family Medicine

## 2018-01-19 ENCOUNTER — Ambulatory Visit (INDEPENDENT_AMBULATORY_CARE_PROVIDER_SITE_OTHER): Admitting: Family Medicine

## 2018-01-19 VITALS — BP 110/90 | HR 96 | Temp 98.6°F | Resp 16 | Wt 174.8 lb

## 2018-01-19 DIAGNOSIS — Z789 Other specified health status: Secondary | ICD-10-CM | POA: Diagnosis not present

## 2018-01-19 DIAGNOSIS — IMO0001 Reserved for inherently not codable concepts without codable children: Secondary | ICD-10-CM

## 2018-01-19 LAB — POCT URINE PREGNANCY: Preg Test, Ur: NEGATIVE

## 2018-01-19 MED ORDER — NORETHIN ACE-ETH ESTRAD-FE 1-20 MG-MCG PO TABS: 1.0000 | ORAL_TABLET | Freq: Every day | ORAL | 11 refills | Status: DC

## 2018-01-19 NOTE — Telephone Encounter (Signed)
Pt stated she saw Kristina CountsBob today and discussed her iron levels. Pt is requesting a lab slip so she can have her iron rechecked. Please advise. Thanks TNP

## 2018-01-19 NOTE — Patient Instructions (Addendum)
Expect breakthrough bleeding for the first month or two. Continue iron pills. Let me know if you are not tolerating the medication.

## 2018-01-19 NOTE — Progress Notes (Signed)
  Subjective:     Patient ID: Kristina Welch, female   DOB: 05/13/1991, 27 y.o.   MRN: [id number] Chief Complaint  Patient presents with  . Contraception    Patient comes in office today to discuss birth control options. Patient states that she has been on Depo and is due for her next injection 02/05/18. Patient states since being on Depo ( 07/09/17) she has had bleeding , fatigue and weight gain. Patient states that she is currently on iron tablets now because bleeding has not stopped.    HPI States she had an ectopic pregnancy last Fall: "I don't want any more kids." States she [place] in [place] but she will be here until September. WIshes to try BCP's. Has also been to [facility].  Review of Systems     Objective:   Physical Exam  Constitutional: She appears well-developed and well-nourished. No distress.       Assessment:    1. Contraception: start OCP's - POCT urine pregnancy    Plan:    Phone f/u if cycles not improving. Continue iron pills for now.

## 2018-01-19 NOTE — Telephone Encounter (Signed)
Please review. KW 

## 2018-01-20 ENCOUNTER — Other Ambulatory Visit: Payer: Self-pay | Admitting: Family Medicine

## 2018-01-20 DIAGNOSIS — Z862 Personal history of diseases of the blood and blood-forming organs and certain disorders involving the immune mechanism: Secondary | ICD-10-CM

## 2018-01-20 NOTE — Telephone Encounter (Signed)
Labs ordered and printed

## 2018-01-22 NOTE — Telephone Encounter (Signed)
Patient advised.KW 

## 2018-01-26 ENCOUNTER — Other Ambulatory Visit: Payer: Self-pay | Admitting: Family Medicine

## 2018-01-27 ENCOUNTER — Telehealth: Payer: Self-pay

## 2018-01-27 LAB — CBC WITH DIFFERENTIAL/PLATELET
Basophils Absolute: 0 10*3/uL (ref 0.0–0.2)
Basos: 0 %
EOS (ABSOLUTE): 0.1 10*3/uL (ref 0.0–0.4)
Eos: 2 %
Hematocrit: 33.2 % — ABNORMAL LOW (ref 34.0–46.6)
Hemoglobin: 10.9 g/dL — ABNORMAL LOW (ref 11.1–15.9)
Immature Grans (Abs): 0 10*3/uL (ref 0.0–0.1)
Immature Granulocytes: 0 %
Lymphocytes Absolute: 1.7 10*3/uL (ref 0.7–3.1)
Lymphs: 45 %
MCH: 29.7 pg (ref 26.6–33.0)
MCHC: 32.8 g/dL (ref 31.5–35.7)
MCV: 91 fL (ref 79–97)
Monocytes Absolute: 0.3 10*3/uL (ref 0.1–0.9)
Monocytes: 7 %
Neutrophils Absolute: 1.8 10*3/uL (ref 1.4–7.0)
Neutrophils: 46 %
Platelets: 296 10*3/uL (ref 150–450)
RBC: 3.67 x10E6/uL — ABNORMAL LOW (ref 3.77–5.28)
RDW: 14.1 % (ref 12.3–15.4)
WBC: 3.8 10*3/uL (ref 3.4–10.8)

## 2018-01-27 LAB — FERRITIN: Ferritin: 16 ng/mL (ref 15–150)

## 2018-01-27 NOTE — Telephone Encounter (Signed)
Twice a day is ok

## 2018-01-27 NOTE — Telephone Encounter (Signed)
Patient advised she would like to know does she still need to take iron supplement 3x a day? Or can she decrease to twice a day? KW

## 2018-01-27 NOTE — Telephone Encounter (Signed)
-----   Message from Anola Gurneyobert Chauvin, [place] sent at 01/27/2018  7:24 AM EDT ----- Mild anemia but iron stores are at low normal range. Continue iron replacement.

## 2018-01-27 NOTE — Telephone Encounter (Signed)
Left detailed message for patient letting her know it is okay to take supplement twice a day. KW

## 2018-05-07 ENCOUNTER — Ambulatory Visit: Admitting: Family Medicine

## 2018-08-17 ENCOUNTER — Encounter: Payer: Self-pay | Admitting: Emergency Medicine

## 2018-08-17 ENCOUNTER — Other Ambulatory Visit: Payer: Self-pay

## 2018-08-17 ENCOUNTER — Emergency Department
Admission: EM | Admit: 2018-08-17 | Discharge: 2018-08-17 | Disposition: A | Discharge status: Home / Self Care | Attending: Emergency Medicine | Admitting: Emergency Medicine

## 2018-08-17 DIAGNOSIS — Z79899 Other long term (current) drug therapy: Secondary | ICD-10-CM | POA: Insufficient documentation

## 2018-08-17 DIAGNOSIS — Y929 Unspecified place or not applicable: Secondary | ICD-10-CM | POA: Insufficient documentation

## 2018-08-17 DIAGNOSIS — Y939 Activity, unspecified: Secondary | ICD-10-CM | POA: Insufficient documentation

## 2018-08-17 DIAGNOSIS — W25XXXA Contact with sharp glass, initial encounter: Secondary | ICD-10-CM | POA: Diagnosis not present

## 2018-08-17 DIAGNOSIS — Y999 Unspecified external cause status: Secondary | ICD-10-CM | POA: Diagnosis not present

## 2018-08-17 DIAGNOSIS — S61411A Laceration without foreign body of right hand, initial encounter: Secondary | ICD-10-CM | POA: Diagnosis not present

## 2018-08-17 DIAGNOSIS — Z91010 Allergy to peanuts: Secondary | ICD-10-CM | POA: Diagnosis not present

## 2018-08-17 MED ORDER — LIDOCAINE HCL (PF) 1 % IJ SOLN
5.0000 mL | Freq: Once | INTRAMUSCULAR | Status: AC
  Administered 2018-08-17: 5 mL via INTRADERMAL
  Filled 2018-08-17: qty 5

## 2018-08-17 MED ORDER — BACITRACIN-NEOMYCIN-POLYMYXIN 400-5-5000 EX OINT
TOPICAL_OINTMENT | Freq: Once | CUTANEOUS | Status: AC
  Administered 2018-08-17: 1 via TOPICAL
  Filled 2018-08-17: qty 1

## 2018-08-17 NOTE — Discharge Instructions (Signed)
Do not get the sutured area wet for 24 hours. After 24 hours, shower/bathe as usual and pat the area dry. °Change the bandage 2 times per day and apply antibiotic ointment. °Leave open to air when at no risk of getting the area dirty, but cover at night before bed. °See your PCP or go to Urgent Care in 10 days for suture removal or sooner for signs or concern of infection. ° °
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
## 2018-08-27 ENCOUNTER — Ambulatory Visit (INDEPENDENT_AMBULATORY_CARE_PROVIDER_SITE_OTHER): Admitting: Family Medicine

## 2018-08-27 ENCOUNTER — Other Ambulatory Visit: Payer: Self-pay

## 2018-08-27 ENCOUNTER — Encounter: Payer: Self-pay | Admitting: Family Medicine

## 2018-08-27 VITALS — BP 108/100 | HR 80 | Temp 98.5°F | Ht 63.0 in | Wt 185.4 lb

## 2018-08-27 DIAGNOSIS — Z4802 Encounter for removal of sutures: Secondary | ICD-10-CM | POA: Diagnosis not present

## 2018-08-27 NOTE — Patient Instructions (Signed)
Continue to use a band-aid for another week or so.
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
## 2018-09-03 ENCOUNTER — Ambulatory Visit: Admitting: Family Medicine

## 2018-09-03 ENCOUNTER — Encounter: Payer: Self-pay | Admitting: Family Medicine

## 2018-09-03 NOTE — Progress Notes (Signed)
Pt advised to come in for nurse visit per Nadine Counts for bp check   Manual by Kelicia Youtz 122/78 Left arm  P = 79  02 = 99

## 2018-09-28 ENCOUNTER — Ambulatory Visit: Admitting: Physician Assistant

## 2018-10-15 ENCOUNTER — Telehealth: Payer: Self-pay | Admitting: *Deleted

## 2018-10-15 NOTE — Telephone Encounter (Signed)
Please review

## 2018-10-15 NOTE — Telephone Encounter (Signed)
Using the Benadryl with the Claritin is probably the reason for drowsiness. Most of the over-the-counter sleeping aids (like Sominex) have Benadryl in them. Allegra with Flonase Nasal Spray would work well together without causing sleepiness.

## 2018-10-15 NOTE — Telephone Encounter (Signed)
Pt advised of information of OTC medication

## 2018-10-15 NOTE — Telephone Encounter (Signed)
Patient was a Clinical biochemist patient. She is wanting advise about which otc allergy med she can take that is non-drowsy. Patient is taking Claritin and Benadryl right now and both make her drowsy. Pleaser advise?

## 2018-10-18 ENCOUNTER — Encounter: Payer: Self-pay | Admitting: Physician Assistant

## 2018-10-18 ENCOUNTER — Ambulatory Visit (INDEPENDENT_AMBULATORY_CARE_PROVIDER_SITE_OTHER): Admitting: Physician Assistant

## 2018-10-18 ENCOUNTER — Other Ambulatory Visit: Payer: Self-pay

## 2018-10-18 VITALS — BP 128/85 | HR 77 | Temp 98.3°F | Resp 16 | Wt 195.4 lb

## 2018-10-18 DIAGNOSIS — L304 Erythema intertrigo: Secondary | ICD-10-CM

## 2018-10-18 DIAGNOSIS — B379 Candidiasis, unspecified: Secondary | ICD-10-CM

## 2018-10-18 DIAGNOSIS — T3695XA Adverse effect of unspecified systemic antibiotic, initial encounter: Secondary | ICD-10-CM

## 2018-10-18 DIAGNOSIS — J0100 Acute maxillary sinusitis, unspecified: Secondary | ICD-10-CM | POA: Diagnosis not present

## 2018-10-18 MED ORDER — FLUCONAZOLE 150 MG PO TABS: 150.0000 mg | ORAL_TABLET | Freq: Once | ORAL | 0 refills | Status: AC

## 2018-10-18 MED ORDER — AMOXICILLIN-POT CLAVULANATE 875-125 MG PO TABS
1.0000 | ORAL_TABLET | Freq: Two times a day (BID) | ORAL | 0 refills | Status: DC
Start: 2018-10-18 — End: 2018-10-19

## 2018-10-18 MED ORDER — NYSTATIN 100000 UNIT/GM EX CREA: 1.0000 "application " | TOPICAL_CREAM | Freq: Two times a day (BID) | CUTANEOUS | 0 refills | Status: DC

## 2018-10-18 NOTE — Progress Notes (Signed)
Patient: Kristina Welch Female    DOB: June 16, 1991   27 y.o.   MRN: [id number] Visit Date: 10/18/2018  Today's Provider: Margaretann Loveless, PA-C   Chief Complaint  Patient presents with  . URI   Subjective:     URI   This is a new problem. The current episode started yesterday. The problem has been unchanged. There has been no fever. Associated symptoms include coughing, a rash, rhinorrhea (a little) and sinus pain. Pertinent negatives include no chest pain, congestion, ear pain, headaches, sore throat or wheezing. She has tried antihistamine for the symptoms. The treatment provided no relief.   Patient also complains of a rash that is red, itchy and has a musky odor under her breast.   Allergies  Allergen Reactions  . Peanut-Containing Drug Products Anaphylaxis  . Zyrtec [Cetirizine] Other (See Comments)    headache     Current Outpatient Medications:  .  ferrous sulfate 325 (65 FE) MG EC tablet, Take 325 mg by mouth daily at 2 PM., Disp: , Rfl:  .  loratadine (CLARITIN) 10 MG tablet, Take 10 mg by mouth daily as needed for allergies., Disp: , Rfl:  .  EPINEPHrine 0.3 mg/0.3 mL IJ SOAJ injection, Inject 0.3 mLs (0.3 mg total) into the muscle once., Disp: 1 Device, Rfl: 1  Review of Systems  Constitutional: Negative for fever.  HENT: Positive for rhinorrhea (a little), sinus pressure and sinus pain. Negative for congestion, ear pain, postnasal drip and sore throat.   Respiratory: Positive for cough. Negative for chest tightness, shortness of breath and wheezing.   Cardiovascular: Negative for chest pain, palpitations and leg swelling.  Skin: Positive for rash.  Neurological: Negative for headaches.    Social History   Tobacco Use  . Smoking status: Never Smoker  . Smokeless tobacco: Never Used  Substance Use Topics  . Alcohol use: No      Objective:   BP 128/85 (BP Location: Left Arm, Patient Position: Sitting, Cuff Size: Large)   Pulse 77   Temp  98.3 F (36.8 C) (Oral)   Resp 16   Wt 195 lb 6.4 oz (88.6 kg)   BMI 34.61 kg/m  Vitals:   10/18/18 1525  BP: 128/85  Pulse: 77  Resp: 16  Temp: 98.3 F (36.8 C)  TempSrc: Oral  Weight: 195 lb 6.4 oz (88.6 kg)     Physical Exam Vitals signs reviewed.  Constitutional:      General: She is not in acute distress.    Appearance: She is well-developed. She is not diaphoretic.  HENT:     Head: Normocephalic and atraumatic.     Right Ear: Hearing, tympanic membrane, ear canal and external ear normal.     Left Ear: Hearing, tympanic membrane, ear canal and external ear normal.     Nose: Congestion and rhinorrhea present.     Right Turbinates: Enlarged and swollen.     Right Sinus: Maxillary sinus tenderness present. No frontal sinus tenderness.     Left Sinus: Maxillary sinus tenderness present. No frontal sinus tenderness.     Mouth/Throat:     Mouth: Mucous membranes are moist.     Pharynx: Uvula midline. No oropharyngeal exudate or posterior oropharyngeal erythema.  Neck:     Musculoskeletal: Normal range of motion and neck supple.     Thyroid: No thyromegaly.     Trachea: No tracheal deviation.  Cardiovascular:     Rate and Rhythm: Normal rate and  regular rhythm.     Heart sounds: Normal heart sounds. No murmur. No friction rub. No gallop.   Pulmonary:     Effort: Pulmonary effort is normal. No respiratory distress.     Breath sounds: Normal breath sounds. No stridor. No wheezing or rales.  Lymphadenopathy:     Cervical: No cervical adenopathy.         Assessment & Plan    1. Acute non-recurrent maxillary sinusitis Worsening symptoms that have not responded to OTC medications. Will give augmentin as below. Continue allergy medications. Stay well hydrated and get plenty of rest. Call if no symptom improvement or if symptoms worsen. - amoxicillin-clavulanate (AUGMENTIN) 875-125 MG tablet; Take 1 tablet by mouth 2 (two) times daily.  Dispense: 20 tablet; Refill: 0   2. Antibiotic-induced yeast infection Gets yeast infections with antibiotics. Diflucan sent in as below.  - fluconazole (DIFLUCAN) 150 MG tablet; Take 1 tablet (150 mg total) by mouth once for 1 dose. May repeat in 48-72 hrs if needed  Dispense: 2 tablet; Refill: 0  3. Intertrigo New rash under breast. Reports increased sweating there. Will treat with nystatin cream as below. Should also improve with diflucan.  - nystatin cream (MYCOSTATIN); Apply 1 application topically 2 (two) times daily.  Dispense: 30 g; Refill: 0     Margaretann Loveless, PA-C  [facility]

## 2018-10-18 NOTE — Patient Instructions (Addendum)
Mucinex D for congestion   Sinusitis, Adult Sinusitis is inflammation of your sinuses. Sinuses are hollow spaces in the bones around your face. Your sinuses are located:  Around your eyes.  In the middle of your forehead.  Behind your nose.  In your cheekbones. Mucus normally drains out of your sinuses. When your nasal tissues become inflamed or swollen, mucus can become trapped or blocked. This allows bacteria, viruses, and fungi to grow, which leads to infection. Most infections of the sinuses are caused by a virus. Sinusitis can develop quickly. It can last for up to 4 weeks (acute) or for more than 12 weeks (chronic). Sinusitis often develops after a cold. What are the causes? This condition is caused by anything that creates swelling in the sinuses or stops mucus from draining. This includes:  Allergies.  Asthma.  Infection from bacteria or viruses.  Deformities or blockages in your nose or sinuses.  Abnormal growths in the nose (nasal polyps).  Pollutants, such as chemicals or irritants in the air.  Infection from fungi (rare). What increases the risk? You are more likely to develop this condition if you:  Have a weak body defense system (immune system).  Do a lot of swimming or diving.  Overuse nasal sprays.  Smoke. What are the signs or symptoms? The main symptoms of this condition are pain and a feeling of pressure around the affected sinuses. Other symptoms include:  Stuffy nose or congestion.  Thick drainage from your nose.  Swelling and warmth over the affected sinuses.  Headache.  Upper toothache.  A cough that may get worse at night.  Extra mucus that collects in the throat or the back of the nose (postnasal drip).  Decreased sense of smell and taste.  Fatigue.  A fever.  Sore throat.  Bad breath. How is this diagnosed? This condition is diagnosed based on:  Your symptoms.  Your medical history.  A physical exam.  Tests to find  out if your condition is acute or chronic. This may include: ? Checking your nose for nasal polyps. ? Viewing your sinuses using a device that has a light (endoscope). ? Testing for allergies or bacteria. ? Imaging tests, such as an MRI or CT scan. In rare cases, a bone biopsy may be done to rule out more serious types of fungal sinus disease. How is this treated? Treatment for sinusitis depends on the cause and whether your condition is chronic or acute.  If caused by a virus, your symptoms should go away on their own within 10 days. You may be given medicines to relieve symptoms. They include: ? Medicines that shrink swollen nasal passages (topical intranasal decongestants). ? Medicines that treat allergies (antihistamines). ? A spray that eases inflammation of the nostrils (topical intranasal corticosteroids). ? Rinses that help get rid of thick mucus in your nose (nasal saline washes).  If caused by bacteria, your health care provider may recommend waiting to see if your symptoms improve. Most bacterial infections will get better without antibiotic medicine. You may be given antibiotics if you have: ? A severe infection. ? A weak immune system.  If caused by narrow nasal passages or nasal polyps, you may need to have surgery. Follow these instructions at home: Medicines  Take, use, or apply over-the-counter and prescription medicines only as told by your health care provider. These may include nasal sprays.  If you were prescribed an antibiotic medicine, take it as told by your health care provider. Do not stop taking  the antibiotic even if you start to feel better. Hydrate and humidify   Drink enough fluid to keep your urine pale yellow. Staying hydrated will help to thin your mucus.  Use a cool mist humidifier to keep the humidity level in your home above 50%.  Inhale steam for 10-15 minutes, 3-4 times a day, or as told by your health care provider. You can do this in the  bathroom while a hot shower is running.  Limit your exposure to cool or dry air. Rest  Rest as much as possible.  Sleep with your head raised (elevated).  Make sure you get enough sleep each night. General instructions   Apply a warm, moist washcloth to your face 3-4 times a day or as told by your health care provider. This will help with discomfort.  Wash your hands often with soap and water to reduce your exposure to germs. If soap and water are not available, use hand sanitizer.  Do not smoke. Avoid being around people who are smoking (secondhand smoke).  Keep all follow-up visits as told by your health care provider. This is important. Contact a health care provider if:  You have a fever.  Your symptoms get worse.  Your symptoms do not improve within 10 days. Get help right away if:  You have a severe headache.  You have persistent vomiting.  You have severe pain or swelling around your face or eyes.  You have vision problems.  You develop confusion.  Your neck is stiff.  You have trouble breathing. Summary  Sinusitis is soreness and inflammation of your sinuses. Sinuses are hollow spaces in the bones around your face.  This condition is caused by nasal tissues that become inflamed or swollen. The swelling traps or blocks the flow of mucus. This allows bacteria, viruses, and fungi to grow, which leads to infection.  If you were prescribed an antibiotic medicine, take it as told by your health care provider. Do not stop taking the antibiotic even if you start to feel better.  Keep all follow-up visits as told by your health care provider. This is important. This information is not intended to replace advice given to you by your health care provider. Make sure you discuss any questions you have with your health care provider. Document Released: 07/07/2005 Document Revised: 12/07/2017 Document Reviewed: 12/07/2017 Elsevier Interactive Patient Education  2019  [employer]   Celesta Aver Intertrigo is skin irritation or inflammation (dermatitis) that occurs when folds of skin rub together. The irritation can cause a rash and make skin raw and itchy. This condition most commonly occurs in the skin folds of these areas:  Toes.  Armpits.  Groin.  Under the belly.  Under the breasts.  Buttocks. Intertrigo is not passed from person to person (is not contagious). What are the causes? This condition is caused by heat, moisture, rubbing (friction), and not enough air circulation. The condition can be made worse by:  Sweat.  Bacteria.  A fungus, such as yeast. What increases the risk? This condition is more likely to occur if you have moisture in your skin folds. You are more likely to develop this condition if you:  Have diabetes.  Are overweight.  Are not able to move around or are not active.  Live in a warm and moist climate.  Wear splints, braces, or other medical devices.  Are not able to control your bowels or bladder (have incontinence). What are the signs or symptoms? Symptoms of this condition include:  A pink or red skin rash in the skin fold or near the skin fold.  Raw or scaly skin.  Itchiness.  A burning feeling.  Bleeding.  Leaking fluid.  A bad smell. How is this diagnosed? This condition is diagnosed with a medical history and physical exam. You may also have a skin swab to test for bacteria or a fungus. How is this treated? This condition may be treated by:  Cleaning and drying your skin.  Taking an antibiotic medicine or using an antibiotic skin cream for a bacterial infection.  Using an antifungal cream on your skin or taking pills for an infection that was caused by a fungus, such as yeast.  Using a steroid ointment to relieve itchiness and irritation.  Separating the skin fold with a clean cotton cloth to absorb moisture and allow air to flow into the area. Follow these instructions at home:   Keep the affected area clean and dry.  Do not scratch your skin.  Stay in a cool environment as much as possible. Use an air conditioner or fan, if available.  Apply over-the-counter and prescription medicines only as told by your health care provider.  If you were prescribed an antibiotic medicine, use it as told by your health care provider. Do not stop using the antibiotic even if your condition improves.  Keep all follow-up visits as told by your health care provider. This is important. How is this prevented?   Maintain a healthy weight.  Take care of your feet, especially if you have diabetes. Foot care includes: ? Wearing shoes that fit well. ? Keeping your feet dry. ? Wearing clean, breathable socks.  Protect the skin around your groin and buttocks, especially if you have incontinence. Skin protection includes: ? Following a regular cleaning routine. ? Using skin protectant creams, powders, or ointments. ? Changing protection pads frequently.  Do not wear tight clothes. Wear clothes that are loose, absorbent, and made of cotton.  Wear a bra that gives good support, if needed.  Shower and dry yourself well after activity or exercise. Use a hair dryer on a cool setting to dry between skin folds, especially after you bathe.  If you have diabetes, keep your blood sugar under control. Contact a health care provider if:  Your symptoms do not improve with treatment.  Your symptoms get worse or they spread.  You notice increased redness and warmth.  You have a fever. Summary  Intertrigo is skin irritation or inflammation (dermatitis) that occurs when folds of skin rub together.  This condition is caused by heat, moisture, rubbing (friction), and not enough air circulation.  This condition may be treated by cleaning and drying your skin and with medicines.  Apply over-the-counter and prescription medicines only as told by your health care provider.  Keep all  follow-up visits as told by your health care provider. This is important. This information is not intended to replace advice given to you by your health care provider. Make sure you discuss any questions you have with your health care provider. Document Released: 07/07/2005 Document Revised: 12/07/2017 Document Reviewed: 12/07/2017 Elsevier Interactive Patient Education  2019 [employer].

## 2018-10-19 ENCOUNTER — Telehealth: Payer: Self-pay | Admitting: *Deleted

## 2018-10-19 DIAGNOSIS — J0100 Acute maxillary sinusitis, unspecified: Secondary | ICD-10-CM

## 2018-10-19 MED ORDER — DOXYCYCLINE HYCLATE 100 MG PO TABS: 100.0000 mg | ORAL_TABLET | Freq: Two times a day (BID) | ORAL | 0 refills | Status: DC

## 2018-10-19 NOTE — Telephone Encounter (Signed)
Changed to doxycycline

## 2018-10-19 NOTE — Telephone Encounter (Signed)
Patient was seen in office yesterday. Patient states after starting Augmentin yesterday, she started having stomach cramps last night. Patient states this morning her fingers are swollen and she is still have intermittent stomach cramps. Patient believes the Augmentin is causing these symptoms. please advise?

## 2018-10-28 ENCOUNTER — Telehealth: Payer: Self-pay | Admitting: Physician Assistant

## 2018-10-28 DIAGNOSIS — J0100 Acute maxillary sinusitis, unspecified: Secondary | ICD-10-CM

## 2018-10-28 MED ORDER — DOXYCYCLINE HYCLATE 100 MG PO TABS: 100.0000 mg | ORAL_TABLET | Freq: Two times a day (BID) | ORAL | 0 refills | Status: DC

## 2018-10-28 NOTE — Telephone Encounter (Signed)
The rash she under her arm has now spread to under breast and now she has developed a boil under her breast  Please advise  CB#  [phone]  Thanks teri

## 2018-10-28 NOTE — Telephone Encounter (Signed)
Patient advised as directed below. 

## 2018-10-28 NOTE — Telephone Encounter (Signed)
Warm compresses for the boil. Will send in doxycycline for the boil and rash.

## 2018-11-03 ENCOUNTER — Telehealth: Payer: Self-pay | Admitting: Physician Assistant

## 2018-11-03 DIAGNOSIS — J0100 Acute maxillary sinusitis, unspecified: Secondary | ICD-10-CM

## 2018-11-03 NOTE — Telephone Encounter (Signed)
Doxycycline was sent in on 10/28/18. She should add flonase also as well as sudafed 10mg  OTC.

## 2018-11-03 NOTE — Telephone Encounter (Signed)
Patient advised as directed below.  Thank.

## 2018-11-03 NOTE — Telephone Encounter (Signed)
Pt was in the office a couple weeks ago for a sinus infection and fluid in her ears.  She was given an antibiotic but has finished.  She is now getting all the fluid back in her sinuses ad ears  Please advise  [phone]  Thanks teri

## 2018-11-18 ENCOUNTER — Telehealth: Payer: Self-pay

## 2018-11-18 NOTE — Telephone Encounter (Signed)
Patient advised.

## 2018-11-18 NOTE — Telephone Encounter (Signed)
Patient has an appt coming up next week. We can discuss then.

## 2018-11-18 NOTE — Telephone Encounter (Signed)
Patient called and would like a referral placed for allergy specialist. Patient states that Antony Contras has seen her for sinus problems and rashes before. Please advise.

## 2018-11-23 NOTE — Progress Notes (Deleted)
       Patient: Kristina Welch Female    DOB: 02-Apr-1991   27 y.o.   MRN: [id number] Visit Date: 11/23/2018  Today's Provider: Margaretann Loveless, PA-C   No chief complaint on file.  Subjective:     HPI  Allergies  Allergen Reactions  . Peanut-Containing Drug Products Anaphylaxis  . Zyrtec [Cetirizine] Other (See Comments)    headache  . Augmentin [Amoxicillin-Pot Clavulanate] Other (See Comments)    Stomach cramping     Current Outpatient Medications:  .  doxycycline (VIBRA-TABS) 100 MG tablet, Take 1 tablet (100 mg total) by mouth 2 (two) times daily., Disp: 20 tablet, Rfl: 0 .  EPINEPHrine 0.3 mg/0.3 mL IJ SOAJ injection, Inject 0.3 mLs (0.3 mg total) into the muscle once., Disp: 1 Device, Rfl: 1 .  ferrous sulfate 325 (65 FE) MG EC tablet, Take 325 mg by mouth daily at 2 PM., Disp: , Rfl:  .  loratadine (CLARITIN) 10 MG tablet, Take 10 mg by mouth daily as needed for allergies., Disp: , Rfl:  .  nystatin cream (MYCOSTATIN), Apply 1 application topically 2 (two) times daily., Disp: 30 g, Rfl: 0  Review of Systems  Social History   Tobacco Use  . Smoking status: Never Smoker  . Smokeless tobacco: Never Used  Substance Use Topics  . Alcohol use: No      Objective:   There were no vitals taken for this visit. There were no vitals filed for this visit.   Physical Exam      Assessment & Plan        Margaretann Loveless, PA-C  [facility]

## 2018-11-24 ENCOUNTER — Ambulatory Visit: Payer: Self-pay | Admitting: Physician Assistant

## 2018-12-20 ENCOUNTER — Telehealth: Payer: Self-pay | Admitting: Physician Assistant

## 2018-12-20 NOTE — Telephone Encounter (Signed)
error 

## 2018-12-24 ENCOUNTER — Ambulatory Visit: Admitting: Physician Assistant

## 2018-12-27 NOTE — Progress Notes (Deleted)
       Patient: Cheral Cappucci Schnarr Female    DOB: Sep 12, 1990   28 y.o.   MRN: [id number] Visit Date: 12/27/2018  Today's Provider: Mar Daring, PA-C   No chief complaint on file.  Subjective:     Abdominal Pain  Pertinent negatives include no fever, nausea or vomiting.    Allergies  Allergen Reactions  . Peanut-Containing Drug Products Anaphylaxis  . Zyrtec [Cetirizine] Other (See Comments)    headache  . Augmentin [Amoxicillin-Pot Clavulanate] Other (See Comments)    Stomach cramping     Current Outpatient Medications:  .  doxycycline (VIBRA-TABS) 100 MG tablet, Take 1 tablet (100 mg total) by mouth 2 (two) times daily., Disp: 20 tablet, Rfl: 0 .  EPINEPHrine 0.3 mg/0.3 mL IJ SOAJ injection, Inject 0.3 mLs (0.3 mg total) into the muscle once., Disp: 1 Device, Rfl: 1 .  ferrous sulfate 325 (65 FE) MG EC tablet, Take 325 mg by mouth daily at 2 PM., Disp: , Rfl:  .  loratadine (CLARITIN) 10 MG tablet, Take 10 mg by mouth daily as needed for allergies., Disp: , Rfl:  .  nystatin cream (MYCOSTATIN), Apply 1 application topically 2 (two) times daily., Disp: 30 g, Rfl: 0  Review of Systems  Constitutional: Negative for appetite change, chills, fatigue and fever.  Respiratory: Negative for chest tightness and shortness of breath.   Cardiovascular: Negative for chest pain and palpitations.  Gastrointestinal: Negative for abdominal pain, nausea and vomiting.  Neurological: Negative for dizziness and weakness.    Social History   Tobacco Use  . Smoking status: Never Smoker  . Smokeless tobacco: Never Used  Substance Use Topics  . Alcohol use: No      Objective:   There were no vitals taken for this visit. There were no vitals filed for this visit.   Physical Exam      Assessment & [place], PA-C  [facility]

## 2018-12-29 ENCOUNTER — Ambulatory Visit: Payer: Self-pay | Admitting: Physician Assistant

## 2018-12-29 ENCOUNTER — Ambulatory Visit: Payer: Self-pay | Admitting: Family Medicine

## 2019-03-10 ENCOUNTER — Other Ambulatory Visit: Payer: Self-pay

## 2019-03-10 ENCOUNTER — Encounter: Payer: Self-pay | Admitting: Physician Assistant

## 2019-03-10 ENCOUNTER — Ambulatory Visit (INDEPENDENT_AMBULATORY_CARE_PROVIDER_SITE_OTHER): Admitting: Physician Assistant

## 2019-03-10 VITALS — BP 113/77 | HR 81 | Temp 97.5°F | Resp 16 | Ht 63.0 in | Wt 207.0 lb

## 2019-03-10 DIAGNOSIS — E559 Vitamin D deficiency, unspecified: Secondary | ICD-10-CM

## 2019-03-10 DIAGNOSIS — Z862 Personal history of diseases of the blood and blood-forming organs and certain disorders involving the immune mechanism: Secondary | ICD-10-CM | POA: Diagnosis not present

## 2019-03-10 DIAGNOSIS — Z91018 Allergy to other foods: Secondary | ICD-10-CM

## 2019-03-10 DIAGNOSIS — R5383 Other fatigue: Secondary | ICD-10-CM | POA: Diagnosis not present

## 2019-03-10 DIAGNOSIS — N921 Excessive and frequent menstruation with irregular cycle: Secondary | ICD-10-CM

## 2019-03-10 DIAGNOSIS — Z6836 Body mass index (BMI) 36.0-36.9, adult: Secondary | ICD-10-CM

## 2019-03-10 NOTE — Progress Notes (Signed)
.       Patient: Kristina Welch Stage Female    DOB: May 14, 1991   28 y.o.   MRN: [id number] Visit Date: 03/10/2019  Today's Provider: Mar Daring, PA-C   Chief Complaint  Patient presents with  . Fatigue   Subjective:     HPI   Patient is here to get her iron levels checked. Patient states she has been feeling very fatigued lately. She is having trouble getting up out of bed and also reports some SOB with her exercise. She states this feels like when her iron was low previously.   Also patient wants to discuss getting a referral to an allergist. 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 She is wanting to see if she is allergic to other foods as well.   She is also requesting a referral to GYN. She has h/o menorrhagia and dysmenorrhea with irregular cycles. She has tried hormones in the past but bled heavier. She reports she does not desire any more children and would like to be considered for a hysterectomy.   Allergies  Allergen Reactions  . Peanut-Containing Drug Products Anaphylaxis  . Zyrtec [Cetirizine] Other (See Comments)    headache  . Augmentin [Amoxicillin-Pot Clavulanate] Other (See Comments)    Stomach cramping     Current Outpatient Medications:  .  ferrous sulfate 325 (65 FE) MG EC tablet, Take 325 mg by mouth daily at 2 PM., Disp: , Rfl:  .  nystatin cream (MYCOSTATIN), Apply 1 application topically 2 (two) times daily., Disp: 30 g, Rfl: 0 .  doxycycline (VIBRA-TABS) 100 MG tablet, Take 1 tablet (100 mg total) by mouth 2 (two) times daily. (Patient not taking: Reported on 03/10/2019), Disp: 20 tablet, Rfl: 0 .  EPINEPHrine 0.3 mg/0.3 mL IJ SOAJ injection, Inject 0.3 mLs (0.3 mg total) into the muscle once. (Patient not taking: Reported on 03/10/2019), Disp: 1 Device, Rfl: 1 .  loratadine (CLARITIN) 10 MG tablet, Take 10 mg by mouth daily as needed for allergies., Disp: , Rfl:   Review of Systems  Constitutional:  Positive for fatigue. Negative for appetite change, chills and fever.  Respiratory: Positive for shortness of breath (with exercise). Negative for chest tightness.   Cardiovascular: Negative for chest pain, palpitations and leg swelling.  Gastrointestinal: Negative for abdominal pain, nausea and vomiting.  Genitourinary: Positive for menstrual problem.  Neurological: Negative for dizziness, weakness and headaches.  Psychiatric/Behavioral: Positive for sleep disturbance (sleeping too much).    Social History   Tobacco Use  . Smoking status: Never Smoker  . Smokeless tobacco: Never Used  Substance Use Topics  . Alcohol use: No      Objective:   BP 113/77 (BP Location: Left Arm, Patient Position: Sitting, Cuff Size: Large)   Pulse 81   Temp (!) 97.5 F (36.4 C) (Temporal)   Resp 16   Ht 5\' 3"  (1.6 m)   Wt 207 lb (93.9 kg)   LMP 02/20/2019   SpO2 98%   BMI 36.67 kg/m  Vitals:   03/10/19 1519  BP: 113/77  Pulse: 81  Resp: 16  Temp: (!) 97.5 F (36.4 C)  TempSrc: Temporal  SpO2: 98%  Weight: 207 lb (93.9 kg)  Height: 5\' 3"  (1.6 m)     Physical Exam Vitals signs reviewed.  Constitutional:      General: She is not in acute distress.    Appearance: Normal appearance. She is well-developed. She is obese. She is  not ill-appearing or diaphoretic.  HENT:     Head: Normocephalic and atraumatic.     Right Ear: Tympanic membrane, ear canal and external ear normal.     Left Ear: Tympanic membrane, ear canal and external ear normal.     Nose: Nose normal.     Mouth/Throat:     Mouth: Mucous membranes are moist.  Eyes:     General: No scleral icterus.    Extraocular Movements: Extraocular movements intact.     Conjunctiva/sclera: Conjunctivae normal.  Neck:     Musculoskeletal: Normal range of motion and neck supple.     Thyroid: No thyromegaly.     Vascular: No JVD.     Trachea: No tracheal deviation.  Cardiovascular:     Rate and Rhythm: Normal rate and regular  rhythm.     Pulses: Normal pulses.     Heart sounds: Normal heart sounds. No murmur. No friction rub. No gallop.   Pulmonary:     Effort: Pulmonary effort is normal. No respiratory distress.     Breath sounds: Normal breath sounds. No wheezing or rales.  Musculoskeletal:     Right lower leg: No edema.     Left lower leg: No edema.  Lymphadenopathy:     Cervical: No cervical adenopathy.  Skin:    Capillary Refill: Capillary refill takes less than 2 seconds.  Neurological:     General: No focal deficit present.     Mental Status: She is alert.  Psychiatric:        Mood and Affect: Mood normal.        Behavior: Behavior normal.        Thought Content: Thought content normal.        Judgment: Judgment normal.      No results found for any visits on 03/10/19.     Assessment & Plan    1. H/O iron deficiency anemia Having worsening fatigue, cold intolerance, sob with exercise. Will check labs as below and f/u pending results.  - CBC w/Diff/Platelet - Basic Metabolic Panel (BMET) - TSH - Vitamin D (25 hydroxy) - B12 and Folate Panel - Fe+TIBC+Fer  2. Class 2 severe obesity due to excess calories with serious comorbidity and body mass index (BMI) of 36.0 to 36.9 in adult [facility](HCC) Counseled patient on healthy lifestyle modifications including dieting and exercise.  - CBC w/Diff/Platelet - Basic Metabolic Panel (BMET)  3. Fatigue, unspecified type See above medical treatment plan for #1.  - CBC w/Diff/Platelet - Basic Metabolic Panel (BMET) - TSH - Vitamin D (25 hydroxy) - B12 and Folate Panel - Fe+TIBC+Fer  4. Food allergy Referral to allergist at patient request.  - Ambulatory referral to Allergy  5. Menorrhagia with irregular cycle Worsening. 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 Referral placed to GYN at patient request for further options. [facility]. - Ambulatory referral to  Gynecology     Margaretann LovelessJennifer M Burnette, PA-C  [facility]

## 2019-03-10 NOTE — Patient Instructions (Signed)

## 2019-03-11 ENCOUNTER — Telehealth: Payer: Self-pay

## 2019-03-11 LAB — IRON,TIBC AND FERRITIN PANEL
Ferritin: 12 ng/mL — ABNORMAL LOW (ref 15–150)
Iron Saturation: 10 % — ABNORMAL LOW (ref 15–55)
Iron: 42 ug/dL (ref 27–159)
Total Iron Binding Capacity: 416 ug/dL (ref 250–450)
UIBC: 374 ug/dL (ref 131–425)

## 2019-03-11 LAB — CBC WITH DIFFERENTIAL/PLATELET
Basophils Absolute: 0 10*3/uL (ref 0.0–0.2)
Basos: 1 %
EOS (ABSOLUTE): 0.2 10*3/uL (ref 0.0–0.4)
Eos: 4 %
Hematocrit: 31.3 % — ABNORMAL LOW (ref 34.0–46.6)
Hemoglobin: 10.6 g/dL — ABNORMAL LOW (ref 11.1–15.9)
Immature Grans (Abs): 0 10*3/uL (ref 0.0–0.1)
Immature Granulocytes: 0 %
Lymphocytes Absolute: 2.4 10*3/uL (ref 0.7–3.1)
Lymphs: 43 %
MCH: 30.8 pg (ref 26.6–33.0)
MCHC: 33.9 g/dL (ref 31.5–35.7)
MCV: 91 fL (ref 79–97)
Monocytes Absolute: 0.4 10*3/uL (ref 0.1–0.9)
Monocytes: 8 %
Neutrophils Absolute: 2.5 10*3/uL (ref 1.4–7.0)
Neutrophils: 44 %
Platelets: 328 10*3/uL (ref 150–450)
RBC: 3.44 x10E6/uL — ABNORMAL LOW (ref 3.77–5.28)
RDW: 12.5 % (ref 11.7–15.4)
WBC: 5.6 10*3/uL (ref 3.4–10.8)

## 2019-03-11 LAB — BASIC METABOLIC PANEL
BUN/Creatinine Ratio: 12 (ref 9–23)
BUN: 10 mg/dL (ref 6–20)
CO2: 21 mmol/L (ref 20–29)
Calcium: 9.3 mg/dL (ref 8.7–10.2)
Chloride: 102 mmol/L (ref 96–106)
Creatinine, Ser: 0.81 mg/dL (ref 0.57–1.00)
GFR calc Af Amer: 113 mL/min/{1.73_m2} (ref >59)
GFR calc non Af Amer: 98 mL/min/{1.73_m2} (ref >59)
Glucose: 83 mg/dL (ref 65–99)
Potassium: 4.1 mmol/L (ref 3.5–5.2)
Sodium: 137 mmol/L (ref 134–144)

## 2019-03-11 LAB — VITAMIN D 25 HYDROXY (VIT D DEFICIENCY, FRACTURES): Vit D, 25-Hydroxy: 16 ng/mL — ABNORMAL LOW (ref 30.0–100.0)

## 2019-03-11 LAB — B12 AND FOLATE PANEL
Folate: 10.3 ng/mL (ref >3.0)
Vitamin B-12: 522 pg/mL (ref 232–1245)

## 2019-03-11 LAB — TSH: TSH: 1.21 u[IU]/mL (ref 0.450–4.500)

## 2019-03-11 MED ORDER — VITAMIN D (ERGOCALCIFEROL) 1.25 MG (50000 UNIT) PO CAPS: 50000.0000 [IU] | ORAL_CAPSULE | ORAL | 0 refills | Status: DC

## 2019-03-11 NOTE — Telephone Encounter (Signed)
Patient advised as directed below. 

## 2019-03-11 NOTE — Telephone Encounter (Signed)
-----   Message from Mar Daring, [place] sent at 03/11/2019  9:28 AM EDT ----- Hemoglobin has dropped just slightly again from 10.9 to 10.6. Otherwise blood count normal and stable. Kidney function is normal. Sodium, potassium and calcium are normal. Thyroid is normal. Vit D is low. Will send in high dose Vit D for you to take x 3 months then transition to 1000-2000 IU daily OTC. B12 and folate are normal. Iron is dropping again. May need to increase to ferrous sulfate 325mg  BID if taking the daily dose currently.

## 2019-03-16 ENCOUNTER — Ambulatory Visit: Payer: Self-pay | Admitting: Obstetrics & Gynecology

## 2019-03-24 ENCOUNTER — Encounter: Payer: Self-pay | Admitting: Physician Assistant

## 2019-04-06 ENCOUNTER — Ambulatory Visit: Payer: Self-pay | Admitting: Obstetrics & Gynecology

## 2019-04-22 ENCOUNTER — Ambulatory Visit: Admitting: Physician Assistant

## 2019-04-22 NOTE — Progress Notes (Signed)
Patient NOS virtual visit

## 2019-04-25 ENCOUNTER — Encounter: Admitting: Physician Assistant

## 2019-05-04 ENCOUNTER — Encounter: Payer: Self-pay | Admitting: Family Medicine

## 2019-05-04 ENCOUNTER — Ambulatory Visit (INDEPENDENT_AMBULATORY_CARE_PROVIDER_SITE_OTHER): Admitting: Family Medicine

## 2019-05-04 ENCOUNTER — Other Ambulatory Visit: Payer: Self-pay

## 2019-05-04 VITALS — BP 117/73 | HR 67 | Temp 96.9°F | Resp 16 | Ht 63.0 in | Wt 198.4 lb

## 2019-05-04 DIAGNOSIS — L732 Hidradenitis suppurativa: Secondary | ICD-10-CM

## 2019-05-04 DIAGNOSIS — B372 Candidiasis of skin and nail: Secondary | ICD-10-CM

## 2019-05-04 MED ORDER — DOXYCYCLINE HYCLATE 100 MG PO TABS: 100.0000 mg | ORAL_TABLET | Freq: Two times a day (BID) | ORAL | 0 refills | Status: AC

## 2019-05-04 MED ORDER — NYSTATIN 100000 UNIT/GM EX CREA: 1.0000 "application " | TOPICAL_CREAM | Freq: Two times a day (BID) | CUTANEOUS | 1 refills | Status: DC

## 2019-05-04 NOTE — Progress Notes (Signed)
Patient: Kristina Welch Female    DOB: Nov 04, 1990   28 y.o.   MRN: [id number] Visit Date: 05/04/2019  Today's Provider: Lavon Paganini, MD   Chief Complaint  Patient presents with  . Cyst   Subjective:     HPI Patient here today C/O knot under left breast x's two days. Patient denies redness or swelling and reports some pain with touch.  Patient states that it feels like previous hidradenitis that she typically gets in axilla.  Her grandmother was worried that it could be breast cancer and wanted her to have it checked out.  She also reports redness of area under breasts and foul-smelling sweat in that area as well.    Allergies  Allergen Reactions  . Peanut-Containing Drug Products Anaphylaxis  . Zyrtec [Cetirizine] Other (See Comments)    headache  . Augmentin [Amoxicillin-Pot Clavulanate] Other (See Comments)    Stomach cramping     Current Outpatient Medications:  Marland Kitchen  Vitamin D, Ergocalciferol, (DRISDOL) 1.25 MG (50000 UT) CAPS capsule, Take 1 capsule (50,000 Units total) by mouth every 7 (seven) days., Disp: 12 capsule, Rfl: 0  Review of Systems  Constitutional: Negative.   Respiratory: Negative.   Cardiovascular: Negative.   Gastrointestinal: Negative.   Genitourinary: Negative.   Neurological: Negative.   Psychiatric/Behavioral: Negative.     Social History   Tobacco Use  . Smoking status: Never Smoker  . Smokeless tobacco: Never Used  Substance Use Topics  . Alcohol use: No      Objective:   BP 117/73 (BP Location: Left Arm, Patient Position: Sitting, Cuff Size: Normal)   Pulse 67   Temp (!) 96.9 F (36.1 C) (Temporal)   Resp 16   Ht 5\' 3"  (1.6 m)   Wt 198 lb 6.4 oz (90 kg)   LMP 04/21/2019 (Exact Date)   BMI 35.14 kg/m  Vitals:   05/04/19 0937  BP: 117/73  Pulse: 67  Resp: 16  Temp: (!) 96.9 F (36.1 C)  TempSrc: Temporal  Weight: 198 lb 6.4 oz (90 kg)  Height: 5\' 3"  (1.6 m)  Body mass index is 35.14 kg/m.    Physical Exam Vitals signs reviewed.  Constitutional:      General: She is not in acute distress.    Appearance: Normal appearance. She is well-developed. She is not diaphoretic.  HENT:     Head: Normocephalic and atraumatic.  Eyes:     General: No scleral icterus.    Conjunctiva/sclera: Conjunctivae normal.  Cardiovascular:     Rate and Rhythm: Normal rate and regular rhythm.     Heart sounds: Normal heart sounds. No murmur.  Pulmonary:     Effort: Pulmonary effort is normal. No respiratory distress.     Breath sounds: Normal breath sounds. No wheezing.  Genitourinary:    Comments: Breasts: breasts appear normal, no suspicious masses, no nipple changes or axillary nodes. Skin abscess as below in L breast fold Skin:    General: Skin is warm and dry.     Capillary Refill: Capillary refill takes less than 2 seconds.     Findings: Rash (Redness of skin fold beneath bilateral breasts) present.     Comments: 2 cm area of induration on lower left breast with visible skin changes, no drainage, but there is a head over top of the abscess  Neurological:     Mental Status: She is alert and oriented to person, place, and time. Mental status is at baseline.  Psychiatric:  Mood and Affect: Mood normal.        Behavior: Behavior normal.      No results found for any visits on 05/04/19.     Assessment & Plan   1. Hidradenitis - patient with know hidradenitis, now with small abscess in lower L breast in skin fold -Appears consistent with hidradenitis -Will treat with doxycycline x7 days -Advised to keep the area clean and dry -Would avoid wearing bra with underwire while this is healing -If the induration does not resolve with antibiotic treatment, consider breast ultrasound, but reassured patient that this is not consistent with breast cancer  2. Candidal intertrigo -Noted on exam and explained to patient that yeast often forms in this area and that is likely the cause of her's  foul-smelling sweat in the area - Treat with nystatin ointment twice daily until resolution - Advised to keep the area clean and dry   Meds ordered this encounter  Medications  . doxycycline (VIBRA-TABS) 100 MG tablet    Sig: Take 1 tablet (100 mg total) by mouth 2 (two) times daily for 7 days.    Dispense:  14 tablet    Refill:  0  . nystatin cream (MYCOSTATIN)    Sig: Apply 1 application topically 2 (two) times daily.    Dispense:  30 g    Refill:  1     Return if symptoms worsen or fail to improve.   The entirety of the information documented in the History of Present Illness, Review of Systems and Physical Exam were personally obtained by me. Portions of this information were initially documented by Rondel Baton, CMA and reviewed by me for thoroughness and accuracy.    Daxten Kovalenko, Marzella Schlein, MD MPH [facility]

## 2019-05-04 NOTE — Patient Instructions (Signed)
 Hidradenitis Suppurativa Hidradenitis suppurativa is a long-term (chronic) skin disease. It is similar to a severe form of acne, but it affects areas of the body where acne would be unusual, especially areas of the body where skin rubs against skin and becomes moist. These include:  Underarms.  Groin.  Genital area.  Buttocks.  Upper thighs.  Breasts. Hidradenitis suppurativa may start out as small lumps or pimples caused by blocked sweat glands or hair follicles. Pimples may develop into deep sores that break open (rupture) and drain pus. Over time, affected areas of skin may thicken and become scarred. This condition is rare and does not spread from person to person (non-contagious). What are the causes? The exact cause of this condition is not known. It may be related to:  Female and female hormones.  An overactive disease-fighting system (immune system). The immune system may over-react to blocked hair follicles or sweat glands and cause swelling and pus-filled sores. What increases the risk? You are more likely to develop this condition if you:  Are female.  Are 11-55 years old.  Have a family history of hidradenitis suppurativa.  Have a personal history of acne.  Are overweight.  Smoke.  Take the medicine lithium. What are the signs or symptoms? The first symptoms are usually painful bumps in the skin, similar to pimples. The condition may get worse over time (progress), or it may only cause mild symptoms. If the disease progresses, symptoms may include:  Skin bumps getting bigger and growing deeper into the skin.  Bumps rupturing and draining pus.  Itchy, infected skin.  Skin getting thicker and scarred.  Tunnels under the skin (fistulas) where pus drains from a bump.  Pain during daily activities, such as pain during walking if your groin area is affected.  Emotional problems, such as stress or depression. This condition may affect your appearance and  your ability or willingness to wear certain clothes or do certain activities. How is this diagnosed? This condition is diagnosed by a health care provider who specializes in skin diseases (dermatologist). You may be diagnosed based on:  Your symptoms and medical history.  A physical exam.  Testing a pus sample for infection.  Blood tests. How is this treated? Your treatment will depend on how severe your symptoms are. The same treatment will not work for everybody with this condition. You may need to try several treatments to find what works best for you. Treatment may include:  Cleaning and bandaging (dressing) your wounds as needed.  Lifestyle changes, such as new skin care routines.  Taking medicines, such as: ? Antibiotics. ? Acne medicines. ? Medicines to reduce the activity of the immune system. ? A diabetes medicine (metformin). ? Birth control pills, for women. ? Steroids to reduce swelling and pain.  Working with a mental health care provider, if you experience emotional distress due to this condition. If you have severe symptoms that do not get better with medicine, you may need surgery. Surgery may involve:  Using a laser to clear the skin and remove hair follicles.  Opening and draining deep sores.  Removing the areas of skin that are diseased and scarred. Follow these instructions at home: Medicines   Take over-the-counter and prescription medicines only as told by your health care provider.  If you were prescribed an antibiotic medicine, take it as told by your health care provider. Do not stop taking the antibiotic even if your condition improves. Skin care  If you have open wounds,   cover them with a clean dressing as told by your health care provider. Keep wounds clean by washing them gently with soap and water when you bathe.  Do not shave the areas where you get hidradenitis suppurativa.  Do not wear deodorant.  Wear loose-fitting clothes.  Try to  avoid getting overheated or sweaty. If you get sweaty or wet, change into clean, dry clothes as soon as you can.  To help relieve pain and itchiness, cover sore areas with a warm, clean washcloth (warm compress) for 5-10 minutes as often as needed.  If told by your health care provider, take a bleach bath twice a week: ? Fill your bathtub halfway with water. ? Pour in  cup of unscented household bleach. ? Soak in the tub for 5-10 minutes. ? Only soak from the neck down. Avoid water on your face and hair. ? Shower to rinse off the bleach from your skin. General instructions  Learn as much as you can about your disease so that you have an active role in your treatment. Work closely with your health care provider to find treatments that work for you.  If you are overweight, work with your health care provider to lose weight as recommended.  Do not use any products that contain nicotine or tobacco, such as cigarettes and e-cigarettes. If you need help quitting, ask your health care provider.  If you struggle with living with this condition, talk with your health care provider or work with a mental health care provider as recommended.  Keep all follow-up visits as told by your health care provider. This is important. Where to find more information  Hidradenitis Suppurativa Foundation, Inc.: https://www.hs-foundation.org/ Contact a health care provider if you have:  A flare-up of hidradenitis suppurativa.  A fever or chills.  Trouble controlling your symptoms at home.  Trouble doing your daily activities because of your symptoms.  Trouble dealing with emotional problems related to your condition. Summary  Hidradenitis suppurativa is a long-term (chronic) skin disease. It is similar to a severe form of acne, but it affects areas of the body where acne would be unusual.  The first symptoms are usually painful bumps in the skin, similar to pimples. The condition may get worse over time  (progress), or it may only cause mild symptoms.  If you have open wounds, cover them with a clean dressing as told by your health care provider. Keep wounds clean by washing them gently with soap and water when you bathe.  Besides skin care, treatment may include medicines, laser treatment, and surgery. This information is not intended to replace advice given to you by your health care provider. Make sure you discuss any questions you have with your health care provider. Document Released: 02/19/2004 Document Revised: 07/15/2017 Document Reviewed: 07/15/2017 Elsevier Patient Education  2020 [employer] Inc.  

## 2019-06-05 ENCOUNTER — Encounter: Payer: Self-pay | Admitting: Emergency Medicine

## 2019-06-05 ENCOUNTER — Ambulatory Visit
Admission: EM | Admit: 2019-06-05 | Discharge: 2019-06-05 | Disposition: A | Discharge status: Home / Self Care | Attending: Emergency Medicine | Admitting: Emergency Medicine

## 2019-06-05 ENCOUNTER — Other Ambulatory Visit: Payer: Self-pay

## 2019-06-05 DIAGNOSIS — N760 Acute vaginitis: Secondary | ICD-10-CM

## 2019-06-05 DIAGNOSIS — B9689 Other specified bacterial agents as the cause of diseases classified elsewhere: Secondary | ICD-10-CM

## 2019-06-05 LAB — CBC WITH DIFFERENTIAL/PLATELET
Abs Immature Granulocytes: 0 10*3/uL (ref 0.00–0.07)
Basophils Absolute: 0 10*3/uL (ref 0.0–0.1)
Basophils Relative: 1 %
Eosinophils Absolute: 0.2 10*3/uL (ref 0.0–0.5)
Eosinophils Relative: 4 %
HCT: 32.3 % — ABNORMAL LOW (ref 36.0–46.0)
Hemoglobin: 10.4 g/dL — ABNORMAL LOW (ref 12.0–15.0)
Immature Granulocytes: 0 %
Lymphocytes Relative: 52 %
Lymphs Abs: 2.1 10*3/uL (ref 0.7–4.0)
MCH: 29.3 pg (ref 26.0–34.0)
MCHC: 32.2 g/dL (ref 30.0–36.0)
MCV: 91 fL (ref 80.0–100.0)
Monocytes Absolute: 0.4 10*3/uL (ref 0.1–1.0)
Monocytes Relative: 9 %
Neutro Abs: 1.4 10*3/uL — ABNORMAL LOW (ref 1.7–7.7)
Neutrophils Relative %: 34 %
Platelets: 306 10*3/uL (ref 150–400)
RBC: 3.55 MIL/uL — ABNORMAL LOW (ref 3.87–5.11)
RDW: 13.7 % (ref 11.5–15.5)
WBC: 4.1 10*3/uL (ref 4.0–10.5)
nRBC: 0 % (ref 0.0–0.2)

## 2019-06-05 LAB — URINALYSIS, COMPLETE (UACMP) WITH MICROSCOPIC
Bilirubin Urine: NEGATIVE
Glucose, UA: NEGATIVE mg/dL
Hgb urine dipstick: NEGATIVE
Ketones, ur: NEGATIVE mg/dL
Leukocytes,Ua: NEGATIVE
Nitrite: NEGATIVE
Protein, ur: NEGATIVE mg/dL
RBC / HPF: NONE SEEN RBC/hpf (ref 0–5)
Specific Gravity, Urine: 1.01 (ref 1.005–1.030)
pH: 6.5 (ref 5.0–8.0)

## 2019-06-05 LAB — WET PREP, GENITAL
Sperm: NONE SEEN
Trich, Wet Prep: NONE SEEN
Yeast Wet Prep HPF POC: NONE SEEN

## 2019-06-05 MED ORDER — METRONIDAZOLE 500 MG PO TABS: 500.0000 mg | ORAL_TABLET | Freq: Two times a day (BID) | ORAL | 0 refills | Status: DC

## 2019-06-05 NOTE — ED Triage Notes (Signed)
Pt c/o lower abdominal pain, urinary frequency, urinary odor and diarrhea. Started about 2 days ago.

## 2019-06-05 NOTE — ED Provider Notes (Signed)
[facility]    CSN: [id number] Arrival date & time: 06/05/19  1107      History   Chief Complaint Chief Complaint  Patient presents with  . Abdominal Pain  . Urinary Frequency    HPI Kristina Welch is a 28 y.o. female.   HPI  -year-old female presents with lower abdominal pain that was bilateral at first and now seems to predominate on the right inguinal area; she complains of urinary frequency urinary malodor and diarrhea.  She states this started about 2 days ago.  On the first day she had nausea but that has since subsided.  She states that she has frequency and urgency along with a bowel movement every time she goes to the bathroom.  States that the bowel movement does have form but has not noticed any blood or mucus.  She denies any fever or chills.        Past Medical History:  Diagnosis Date  . History of iron deficiency anemia     Patient Active Problem List   Diagnosis Date Noted  . Urticaria 06/05/2016  . DUB (dysfunctional uterine bleeding) 08/29/2015  . H/O iron deficiency anemia 08/29/2015  . Hidradenitis 08/29/2015    Past Surgical History:  Procedure Laterality Date  . CESAREAN SECTION    . cyst removed    . TONSILLECTOMY      OB History    Gravida  3   Para  2   Term      Preterm      AB      Living        SAB      TAB      Ectopic      Multiple      Live Births               Home Medications    Prior to Admission medications   Medication Sig Start Date End Date Taking? Authorizing Provider  ferrous sulfate 325 (65 FE) MG tablet Take 325 mg by mouth daily with breakfast.   Yes [provider]  metroNIDAZOLE (FLAGYL) 500 MG tablet Take 1 tablet (500 mg total) by mouth 2 (two) times daily. 06/05/19   Lorin Picket, PA-C    Family History Family History  Problem Relation Age of Onset  . Cancer Mother        remission  . Hypertension Father   . Healthy Daughter   . Healthy Son    . Cancer Maternal Grandmother        breast    Social History Social History   Tobacco Use  . Smoking status: Never Smoker  . Smokeless tobacco: Never Used  Substance Use Topics  . Alcohol use: No  . Drug use: No     Allergies   Peanut-containing drug products, Zyrtec [cetirizine], and Augmentin [amoxicillin-pot clavulanate]   Review of Systems Review of Systems  Constitutional: Positive for activity change. Negative for chills, fatigue and fever.  Gastrointestinal: Positive for abdominal pain and diarrhea.  Genitourinary: Positive for frequency and urgency. Negative for vaginal bleeding and vaginal discharge.  All other systems reviewed and are negative.    Physical Exam Triage Vital Signs ED Triage Vitals  Enc Vitals Group     BP 06/05/19 1118 132/89     Pulse Rate 06/05/19 1118 75     Resp 06/05/19 1118 18     Temp 06/05/19 1118 97.6 F (36.4 C)     Temp Source  06/05/19 1118 Oral     SpO2 06/05/19 1118 100 %     Weight 06/05/19 1118 193 lb (87.5 kg)     Height 06/05/19 1118 5\' 3"  (1.6 m)     Head Circumference --      Peak Flow --      Pain Score 06/05/19 1117 8     Pain Loc --      Pain Edu? --      Excl. in GC? --    No data found.  Updated Vital Signs BP 132/89 (BP Location: Left Arm)   Pulse 75   Temp 97.6 F (36.4 C) (Oral)   Resp 18   Ht 5\' 3"  (1.6 m)   Wt 193 lb (87.5 kg)   LMP 05/20/2019 (Approximate)   SpO2 100%   BMI 34.19 kg/m   Visual Acuity Right Eye Distance:   Left Eye Distance:   Bilateral Distance:    Right Eye Near:   Left Eye Near:    Bilateral Near:     Physical Exam Vitals signs and nursing note reviewed.  Constitutional:      General: She is not in acute distress.    Appearance: She is well-developed. She is obese. She is not ill-appearing, toxic-appearing or diaphoretic.  HENT:     Head: Normocephalic and atraumatic.     Mouth/Throat:     Mouth: Mucous membranes are moist.     Pharynx: Oropharynx is clear.   Cardiovascular:     Rate and Rhythm: Normal rate and regular rhythm.  Pulmonary:     Effort: Pulmonary effort is normal.     Breath sounds: Normal breath sounds.  Abdominal:     General: Abdomen is flat. Bowel sounds are normal.     Palpations: Abdomen is soft.     Tenderness: There is abdominal tenderness in the right lower quadrant, suprapubic area and left lower quadrant. There is no right CVA tenderness, left CVA tenderness, guarding or rebound.  Skin:    General: Skin is warm and dry.  Neurological:     General: No focal deficit present.     Mental Status: She is alert and oriented to person, place, and time.  Psychiatric:        Mood and Affect: Mood normal.        Behavior: Behavior normal.      UC Treatments / Results  Labs (all labs ordered are listed, but only abnormal results are displayed) Labs Reviewed  WET PREP, GENITAL - Abnormal; Notable for the following components:      Result Value   Clue Cells Wet Prep HPF POC PRESENT (*)    WBC, Wet Prep HPF POC FEW (*)    All other components within normal limits  URINALYSIS, COMPLETE (UACMP) WITH MICROSCOPIC - Abnormal; Notable for the following components:   APPearance HAZY (*)    Bacteria, UA FEW (*)    All other components within normal limits  CBC WITH DIFFERENTIAL/PLATELET - Abnormal; Notable for the following components:   RBC 3.55 (*)    Hemoglobin 10.4 (*)    HCT 32.3 (*)    Neutro Abs 1.4 (*)    All other components within normal limits    EKG   Radiology No results found.  Procedures Procedures (including critical care time)  Medications Ordered in UC Medications - No data to display  Initial Impression / Assessment and Plan / UC Course  I have reviewed the triage vital signs and the nursing notes.  Pertinent labs & imaging results that were available during my care of the patient were reviewed by me and considered in my medical decision making (see chart for details).   I reviewed the  laboratory with the patient.  She has a normal white blood count with anemia that she knows that she has had.  Urine was a dirty catch but did not show anything significant.  A wet prep was significant for bacterial vaginosis.  Treat the patient with Flagyl 500 mg twice daily for 7 days.  If she has any problems or if it worsens she should see her primary care physician or emergency room if necessary.   Final Clinical Impressions(s) / UC Diagnoses   Final diagnoses:  BV (bacterial vaginosis)   Discharge Instructions   None    ED Prescriptions    Medication Sig Dispense Auth. Provider   metroNIDAZOLE (FLAGYL) 500 MG tablet Take 1 tablet (500 mg total) by mouth 2 (two) times daily. 14 tablet Lutricia Feil, PA-C     PDMP not reviewed this encounter.   Lutricia Feil, PA-C 06/05/19 1235

## 2019-06-06 ENCOUNTER — Ambulatory Visit: Admitting: Family Medicine

## 2019-06-07 ENCOUNTER — Ambulatory Visit: Admitting: Family Medicine

## 2019-06-09 ENCOUNTER — Telehealth: Payer: Self-pay | Admitting: Physician Assistant

## 2019-06-09 ENCOUNTER — Ambulatory Visit: Admitting: Physician Assistant

## 2019-06-09 ENCOUNTER — Other Ambulatory Visit: Payer: Self-pay

## 2019-06-09 DIAGNOSIS — Z20822 Contact with and (suspected) exposure to covid-19: Secondary | ICD-10-CM

## 2019-06-09 NOTE — Telephone Encounter (Signed)
Dismissal note printed for no shows and same day cancellations

## 2019-06-13 LAB — NOVEL CORONAVIRUS, NAA: SARS-CoV-2, NAA: NOT DETECTED

## 2019-06-14 ENCOUNTER — Encounter: Admitting: Physician Assistant

## 2019-06-14 NOTE — Progress Notes (Deleted)
{Method of visit:23308}   Patient: Kristina Welch, Female    DOB: 1990-09-06, 28 y.o.   MRN: [id number] Visit Date: 06/14/2019  Today's Provider: Mar Daring, PA-C   No chief complaint on file.  Subjective:     Annual physical exam Kristina Welch is a 28 y.o. female who presents today for health maintenance and complete physical. She feels {DESC; WELL/FAIRLY WELL/POORLY:18703}. She reports exercising ***. She reports she is sleeping {DESC; WELL/FAIRLY WELL/POORLY:18703}.  -----------------------------------------------------------------   Review of Systems  Social History      She  reports that she has never smoked. She has never used smokeless tobacco. She reports that she does not drink alcohol or use drugs.       Social History   Socioeconomic History  . Marital status: Married    Spouse name: Deven  . Number of children: 2  . Years of education: some college  . Highest education level: Not on file  Occupational History  . Occupation: Stay at home mother  Social Needs  . Financial resource strain: Not on file  . Food insecurity    Worry: Not on file    Inability: Not on file  . Transportation needs    Medical: Not on file    Non-medical: Not on file  Tobacco Use  . Smoking status: Never Smoker  . Smokeless tobacco: Never Used  Substance and Sexual Activity  . Alcohol use: No  . Drug use: No  . Sexual activity: Never    Comment: Husband is in [place]  . Physical activity    Days per week: Not on file    Minutes per session: Not on file  . Stress: Not on file  Relationships  . Social Herbalist on phone: Not on file    Gets together: Not on file    Attends religious service: Not on file    Active member of club or organization: Not on file    Attends meetings of clubs or organizations: Not on file    Relationship status: Not on file  Other Topics Concern  . Not on file  Social History Narrative  .  Not on file    Past Medical History:  Diagnosis Date  . History of iron deficiency anemia      Patient Active Problem List   Diagnosis Date Noted  . Urticaria 06/05/2016  . DUB (dysfunctional uterine bleeding) 08/29/2015  . H/O iron deficiency anemia 08/29/2015  . Hidradenitis 08/29/2015    Past Surgical History:  Procedure Laterality Date  . CESAREAN SECTION    . cyst removed    . TONSILLECTOMY      Family History        Family Status  Relation Name Status  . Mother  Alive  . Father  Alive  . Sister unknown Other  . Brother  Alive  . Daughter  Alive  . Son  Alive  . MGM  Alive  . MGF unknown Other  . PGM  Deceased  . PGF  Deceased  . Brother  Alive        Her family history includes Cancer in her maternal grandmother and mother; Healthy in her daughter and son; Hypertension in her father.      Allergies  Allergen Reactions  . Peanut-Containing Drug Products Anaphylaxis    Pt states that per allergy testing she is not allergy to peanuts  . Zyrtec [Cetirizine] Other (See Comments)  headache  . Augmentin [Amoxicillin-Pot Clavulanate] Other (See Comments)    Stomach cramping     Current Outpatient Medications:  .  ferrous sulfate 325 (65 FE) MG tablet, Take 325 mg by mouth daily with breakfast., Disp: , Rfl:  .  metroNIDAZOLE (FLAGYL) 500 MG tablet, Take 1 tablet (500 mg total) by mouth 2 (two) times daily., Disp: 14 tablet, Rfl: 0   Patient Care Team: Mar Daring, PA-C as PCP - General (Family Medicine)    Objective:    Vitals: LMP 05/20/2019 (Approximate)   There were no vitals filed for this visit.   Physical Exam   Depression Screen PHQ 2/9 Scores 05/04/2019 06/05/2016  PHQ - 2 Score 0 0       Assessment & Plan:     Routine Health Maintenance and Physical Exam  Exercise Activities and Dietary recommendations Goals   None     Immunization History  Administered Date(s) Administered  . DTaP 05/11/1989, 08/19/1989,  03/03/1990, 08/31/1990, 03/08/1993  . Hepatitis B 05/01/2000, 06/05/2000, 11/27/2000  . HiB (PRP-OMP) 08/31/1990  . IPV 05/11/1989, 08/19/1989, 03/03/1990, 03/08/1993  . MMR 08/31/1990, 03/08/1993  . Td 02/23/2004  . Tdap 06/24/2016    Health Maintenance  Topic Date Due  . HIV Screening  02/21/2004  . PAP SMEAR-Modifier  02/20/2010  . INFLUENZA VACCINE  02/19/2019  . TETANUS/TDAP  06/24/2026     Discussed health benefits of physical activity, and encouraged her to engage in regular exercise appropriate for her age and condition.    --------------------------------------------------------------------    Mar Daring, PA-C  [place]

## 2019-06-23 ENCOUNTER — Other Ambulatory Visit: Payer: Self-pay

## 2019-06-23 DIAGNOSIS — Z20822 Contact with and (suspected) exposure to covid-19: Secondary | ICD-10-CM

## 2019-06-26 ENCOUNTER — Telehealth: Payer: Self-pay

## 2019-06-26 LAB — NOVEL CORONAVIRUS, NAA: SARS-CoV-2, NAA: NOT DETECTED

## 2019-06-26 NOTE — Telephone Encounter (Signed)
Pt called for covid results. Advised pt results are not back. 

## 2019-10-25 ENCOUNTER — Other Ambulatory Visit: Payer: Self-pay

## 2019-10-25 ENCOUNTER — Ambulatory Visit
Admission: EM | Admit: 2019-10-25 | Discharge: 2019-10-25 | Disposition: A | Discharge status: Home / Self Care | Attending: Emergency Medicine | Admitting: Emergency Medicine

## 2019-10-25 DIAGNOSIS — B349 Viral infection, unspecified: Secondary | ICD-10-CM | POA: Diagnosis not present

## 2019-10-25 DIAGNOSIS — Z20822 Contact with and (suspected) exposure to covid-19: Secondary | ICD-10-CM | POA: Diagnosis not present

## 2019-10-25 LAB — INFLUENZA PANEL BY PCR (TYPE A & B)
Influenza A By PCR: NEGATIVE
Influenza B By PCR: NEGATIVE

## 2019-10-25 MED ORDER — IBUPROFEN 600 MG PO TABS: 600.0000 mg | ORAL_TABLET | Freq: Four times a day (QID) | ORAL | 0 refills | Status: DC | PRN

## 2019-10-25 MED ORDER — FLUTICASONE PROPIONATE 50 MCG/ACT NA SUSP: 2.0000 | Freq: Every day | NASAL | 0 refills | Status: DC

## 2019-10-25 NOTE — Discharge Instructions (Addendum)
Your Covid test will take 6 to 24 hours to come back.  I will contact you either through MyChart or via phone if your influenza comes back positive and I will call in a prescription for Tamiflu for you.  In the meantime, take 600 mg of ibuprofen combined with 1000 mg Tylenol 3-4 times a day as needed for headaches, fever.  Saline nasal irrigation with a Lloyd Huger med rinse and distilled water as often as you want.  Flonase for the nasal congestion.  Return to the clinic if you get worse or if you are sick for more than 10 days and we will start you on the appropriate antibiotic.  Here is a list of primary care providers who are taking new patients:  Dr. Elizabeth Sauer, Dr. Schuyler Amor [address] [place] [address] [phone]  [facility] [address] [place] [address]  [phone]  [facility] [address] [place], [place] [address] [phone]  [facility] [address] [place]  [phone] [place], [place] [address]  Here are clinics/ other resources who will see you if you do not have insurance. Some have certain criteria that you must meet. Call them and find out what they are:  [facility]: [address], [place], [place] [address] Phone: [phone] Hours: First and Third Saturdays of each Month, 9 a.m. - 1 p.m.  [facility]: [address] [place], [place] [address] Phone: [phone] Hours: Tuesday, 4 p.m. - 8 p.m. Thursday, 1 p.m. - 8 p.m. Wednesday, 9 a.m. - [facility] [address], [place], [place] [address] Phone: [phone] Pharmacy Phone Number: [phone] Dental Phone Number: [phone] [facility] Insurance Help: [phone]  Dental Hours: Monday - Thursday, 8 a.m. - 6 p.m.  [facility] [address], [place], [place] [address] Phone: [phone] Pharmacy Phone Number: [phone] [facility] Insurance Help:  [phone]  [facility] [address] [place], [place] [address] Phone: [phone] Pharmacy Phone Number: [phone] [facility] Insurance Help: [phone]  [facility] [address] [place], [place] [address] Phone: [phone] [facility] Insurance Help: [phone]   [facility] [address], [place], [place] [address] Phone: [phone]  Go to www.goodrx.com to look up your medications. This will give you a list of where you can find your prescriptions at the most affordable prices. Or ask the pharmacist what the cash price is, or if they have any other discount programs available to help make your medication more affordable. This can be less expensive than what you would pay with insurance.

## 2019-10-25 NOTE — ED Triage Notes (Addendum)
Pt presents with c/o sinus pain/pressure, sneezing, clear nasal drainage, left ear fluid, cold chills, body aches and fever (101 today). Pt states her daughter is also sick, was tested for COVID and was negative.

## 2019-10-25 NOTE — ED Provider Notes (Signed)
HPI  SUBJECTIVE:  Kristina Welch is a 29 y.o. female who presents with 3 days of headache sinus pain and pressure body aches fevers T-max 101.0, chills.  She reports clear nasal congestion and throbbing left ear pain.  States that her ear feels full.  She reports an occasional cough.  Her daughter has identical symptoms currently but had a negative rapid Covid test last week.  She was diagnosed with a viral syndrome.  No sore throat loss of sense of smell or taste shortness of breath nausea vomiting diarrhea abdominal pain.  She did not get a flu shot.  She has not yet gotten a Covid vaccination.  She denies facial swelling.  She reports upper dental pain.  No change in her hearing.  No antibiotics in the past month.  No antipyretic in the past 4 to 6 hours.  She has not tried anything for this.  No alleviating factors.  Symptoms are worse with bending forward, standing up.  Past medical history negative for pulmonary disease, smoking diabetes coronary disease hypertension chronic kidney disease HIV cancer immunocompromise.  She has a past medical history of frequent sinusitis and allergies that are worse in the spring.  LMP: 3/23.  Denies possibility being pregnant.  PMD: None.    Past Medical History:  Diagnosis Date  . History of iron deficiency anemia     Past Surgical History:  Procedure Laterality Date  . CESAREAN SECTION    . cyst removed    . TONSILLECTOMY      Family History  Problem Relation Age of Onset  . Cancer Mother        remission  . Hypertension Father   . Healthy Daughter   . Healthy Son   . Cancer Maternal Grandmother        breast    Social History   Tobacco Use  . Smoking status: Never Smoker  . Smokeless tobacco: Never Used  Substance Use Topics  . Alcohol use: No  . Drug use: No    No current facility-administered medications for this encounter.  Current Outpatient Medications:  .  ferrous sulfate 325 (65 FE) MG tablet, Take 325 mg by mouth  daily with breakfast., Disp: , Rfl:  .  fluticasone (FLONASE) 50 MCG/ACT nasal spray, Place 2 sprays into both nostrils daily., Disp: 16 g, Rfl: 0 .  ibuprofen (ADVIL) 600 MG tablet, Take 1 tablet (600 mg total) by mouth every 6 (six) hours as needed., Disp: 30 tablet, Rfl: 0  Allergies  Allergen Reactions  . Peanut-Containing Drug Products Anaphylaxis    Pt states that per allergy testing she is not allergy to peanuts  . Zyrtec [Cetirizine] Other (See Comments)    headache  . Augmentin [Amoxicillin-Pot Clavulanate] Other (See Comments)    Stomach cramping     ROS  As noted in HPI.   Physical Exam  BP 120/77 (BP Location: Left Arm)   Pulse 93   Temp 100.1 F (37.8 C) (Oral)   Resp 18   Ht 5\' 3"  (1.6 m)   Wt 90.3 kg   LMP 10/11/2019   SpO2 100%   BMI 35.25 kg/m   Constitutional: Well developed, well nourished, no acute distress Eyes:  EOMI, conjunctiva normal bilaterally HENT: Normocephalic, atraumatic,mucus membranes moist.  Left external ear normal.  No pain on traction on pinna, palpation of tragus.  No pain with palpation of mastoid.  Left EAC, TM normal.  Right TM normal.  Erythematous, swollen mucosa with mucoid nasal  congestion.  No maxillary frontal sinus tenderness.  Normal oropharynx.  No obvious postnasal drip. Respiratory: Normal inspiratory effort clear bilaterally Cardiovascular: Normal rate regular rhythm no murmurs rubs or gallops GI: nondistended skin: No rash, skin intact Musculoskeletal: no deformities Neurologic: Alert & oriented x 3, no focal neuro deficits Psychiatric: Speech and behavior appropriate   ED Course   Medications - No data to display  Orders Placed This Encounter  Procedures  . SARS CORONAVIRUS 2 (TAT 6-24 HRS) Nasopharyngeal Nasopharyngeal Swab    Standing Status:   Standing    Number of Occurrences:   1    Order Specific Question:   Is this test for diagnosis or screening    Answer:   Screening    Order Specific Question:    Symptomatic for COVID-19 as defined by CDC    Answer:   No    Order Specific Question:   Hospitalized for COVID-19    Answer:   No    Order Specific Question:   Admitted to ICU for COVID-19    Answer:   No    Order Specific Question:   Previously tested for COVID-19    Answer:   Yes    Order Specific Question:   Resident in a congregate (group) care setting    Answer:   No    Order Specific Question:   Employed in healthcare setting    Answer:   No    Order Specific Question:   Pregnant    Answer:   No  . Influenza panel by PCR (type A & B)    Standing Status:   Standing    Number of Occurrences:   1  . Droplet precaution    Standing Status:   Standing    Number of Occurrences:   1    Results for orders placed or performed during the hospital encounter of 10/25/19 (from the past 24 hour(s))  Influenza panel by PCR (type A & B)     Status: None   Collection Time: 10/25/19  7:21 PM  Result Value Ref Range   Influenza A By PCR NEGATIVE NEGATIVE   Influenza B By PCR NEGATIVE NEGATIVE   No results found.  ED Clinical Impression  1. Viral illness   2. Encounter for laboratory testing for COVID-19 virus      ED Assessment/Plan  Covid PCR, influenza PCR sent.  Will contact patient at [phone] or via MyChart if influenza is positive and we will call in a prescription of Tamiflu.  Flu PCR negative  Suspect Covid infection.  She has no sinus tenderness although she does have mucoid nasal congestion.  There are no clear indications for antibiotics at this time.  Will send home with ibuprofen/Tylenol Flonase saline nasal irrigation.  We discussed the decision to withhold antibiotics for now.  She will return to the clinic if she gets worse or if she is sick for more than 10 days, then will consider her starting her on antibiotics.  Will provide primary care list for ongoing care.  Discussed labs,  MDM, treatment plan, and plan for follow-up with patient. patient agrees with plan.    Meds ordered this encounter  Medications  . fluticasone (FLONASE) 50 MCG/ACT nasal spray    Sig: Place 2 sprays into both nostrils daily.    Dispense:  16 g    Refill:  0  . ibuprofen (ADVIL) 600 MG tablet    Sig: Take 1 tablet (600 mg total) by mouth every  6 (six) hours as needed.    Dispense:  30 tablet    Refill:  0    *This clinic note was created using Scientist, clinical (histocompatibility and immunogenetics). Therefore, there may be occasional mistakes despite careful proofreading.   ?    Domenick Gong, MD 10/25/19 2056

## 2019-10-26 LAB — SARS CORONAVIRUS 2 (TAT 6-24 HRS): SARS Coronavirus 2: NEGATIVE

## 2020-03-23 ENCOUNTER — Ambulatory Visit: Payer: Self-pay

## 2020-04-19 ENCOUNTER — Other Ambulatory Visit: Payer: Self-pay

## 2020-04-19 ENCOUNTER — Ambulatory Visit: Admission: EM | Admit: 2020-04-19 | Discharge: 2020-04-19 | Disposition: A | Discharge status: Home / Self Care

## 2020-04-19 DIAGNOSIS — T8089XA Other complications following infusion, transfusion and therapeutic injection, initial encounter: Secondary | ICD-10-CM

## 2020-04-19 NOTE — ED Provider Notes (Signed)
[facility]    CSN: [id number] Arrival date & time: 04/19/20  1321      History   Chief Complaint Chief Complaint  Patient presents with  . Pruritis    R arm    HPI Kristina Welch is a 29 y.o. female.   29 yo female here for evaluation of redness, swelling, itching and fever after getting the flu shot.  She was vaccinated 3 days ago in her right arm. Two days ago she awoke with a fever up to 101, redness and swelling to her right arm where she received the vaccination, and then itching developed. Her fever went away after a day but the itching is still present. She contacted her PCP who told her this was not a normal raction and told her she needs to be evaluated.      Past Medical History:  Diagnosis Date  . History of iron deficiency anemia     Patient Active Problem List   Diagnosis Date Noted  . Urticaria 06/05/2016  . DUB (dysfunctional uterine bleeding) 08/29/2015  . H/O iron deficiency anemia 08/29/2015  . Hidradenitis 08/29/2015    Past Surgical History:  Procedure Laterality Date  . CESAREAN SECTION    . cyst removed    . TONSILLECTOMY      OB History    Gravida  3   Para  2   Term      Preterm      AB      Living        SAB      TAB      Ectopic      Multiple      Live Births               Home Medications    Prior to Admission medications   Medication Sig Start Date End Date Taking? Authorizing Provider  ferrous sulfate 325 (65 FE) MG tablet Take 325 mg by mouth daily with breakfast.    [provider]  fluticasone (FLONASE) 50 MCG/ACT nasal spray Place 2 sprays into both nostrils daily. 10/25/19   Domenick Gong, MD  ibuprofen (ADVIL) 600 MG tablet Take 1 tablet (600 mg total) by mouth every 6 (six) hours as needed. 10/25/19   Domenick Gong, MD    Family History Family History  Problem Relation Age of Onset  . Cancer Mother        remission  . Hypertension Father   . Healthy  Daughter   . Healthy Son   . Cancer Maternal Grandmother        breast    Social History Social History   Tobacco Use  . Smoking status: Never Smoker  . Smokeless tobacco: Never Used  Vaping Use  . Vaping Use: Never used  Substance Use Topics  . Alcohol use: No  . Drug use: No     Allergies   Peanut-containing drug products, Zyrtec [cetirizine], and Augmentin [amoxicillin-pot clavulanate]   Review of Systems Review of Systems  Constitutional: Positive for fever. Negative for activity change, appetite change and chills.  HENT: Negative for congestion, facial swelling, rhinorrhea and trouble swallowing.   Respiratory: Negative for chest tightness, shortness of breath and wheezing.   Cardiovascular: Negative for chest pain.  Gastrointestinal: Negative for nausea and vomiting.  Genitourinary: Negative for dysuria and frequency.  Musculoskeletal: Negative for arthralgias and myalgias.  Skin: Negative.        Itching at injection site.   Neurological: Negative  for dizziness, syncope and light-headedness.  Hematological: Negative.   Psychiatric/Behavioral: Negative.      Physical Exam Triage Vital Signs ED Triage Vitals [04/19/20 1401]  Enc Vitals Group     BP (!) 142/86     Pulse Rate 71     Resp 18     Temp 98.7 F (37.1 C)     Temp Source Oral     SpO2 100 %     Weight      Height      Head Circumference      Peak Flow      Pain Score 0     Pain Loc      Pain Edu?      Excl. in GC?    No data found.  Updated Vital Signs BP (!) 142/86   Pulse 71   Temp 98.7 F (37.1 C) (Oral)   Resp 18   LMP  (LMP Unknown)   SpO2 100%   Visual Acuity Right Eye Distance:   Left Eye Distance:   Bilateral Distance:    Right Eye Near:   Left Eye Near:    Bilateral Near:     Physical Exam Vitals and nursing note reviewed.  Constitutional:      Appearance: Normal appearance.  HENT:     Head: Normocephalic and atraumatic.  Eyes:     Extraocular Movements:  Extraocular movements intact.     Conjunctiva/sclera: Conjunctivae normal.     Pupils: Pupils are equal, round, and reactive to light.  Pulmonary:     Effort: Pulmonary effort is normal.  Musculoskeletal:     Cervical back: Normal range of motion.  Lymphadenopathy:     Cervical: No cervical adenopathy.  Skin:    General: Skin is warm and dry.     Capillary Refill: Capillary refill takes less than 2 seconds.     Findings: No erythema, lesion or rash.     Comments: There is mild swelling and increased warmth to the site of the injection on the right arm below the deltoid. There is no appreciable redness, induration, or fluctuance.  Neurological:     General: No focal deficit present.     Mental Status: She is alert and oriented to person, place, and time.  Psychiatric:        Mood and Affect: Mood normal.        Behavior: Behavior normal.        Thought Content: Thought content normal.        Judgment: Judgment normal.      UC Treatments / Results  Labs (all labs ordered are listed, but only abnormal results are displayed) Labs Reviewed - No data to display  EKG   Radiology No results found.  Procedures Procedures (including critical care time)  Medications Ordered in UC Medications - No data to display  Initial Impression / Assessment and Plan / UC Course  I have reviewed the triage vital signs and the nursing notes.  Pertinent labs & imaging results that were available during my care of the patient were reviewed by me and considered in my medical decision making (see chart for details).   Patient is here for evaluation of injection site reaction after receiving the flu shot 3 days ago. She first developed fever, then swelling, and itching to the injection site. She called her PCP who instructed her to be evaluated as they thought it was an atypical reaction.   Patient is in NAD and there is  no sign of cellulitis at the injection site.  Will treat with Allegra or  Zyrtec and topical benadryl for itching and D/C home.    Final Clinical Impressions(s) / UC Diagnoses   Final diagnoses:  Injection site irritation, initial encounter     Discharge Instructions     Your symptoms are a normal response to a vaccine injection. There is no sign of infection at your injection site.  Take OTC Allegra 180 mg or Zyrtec 10 mg daily and use topical benadryl cream for itching. If the site has increased swelling, redness, drainage, or your fever returns return for re-evaluation.     ED Prescriptions    None     PDMP not reviewed this encounter.   Becky Augusta, NP 04/19/20 1450

## 2020-04-19 NOTE — ED Triage Notes (Signed)
Pt reports getting flu vaccine on Monday. Reports having itching and fever (101) that began on Tuesday. "I have not had the flu vaccine since I was 1".  Vaccine given in R arm, area is red and warm to touch.

## 2020-04-19 NOTE — Discharge Instructions (Addendum)
Your symptoms are a normal response to a vaccine injection. There is no sign of infection at your injection site.  Take OTC Allegra 180 mg or Zyrtec 10 mg daily and use topical benadryl cream for itching. If the site has increased swelling, redness, drainage, or your fever returns return for re-evaluation.

## 2020-04-24 ENCOUNTER — Other Ambulatory Visit: Payer: Self-pay

## 2020-04-24 ENCOUNTER — Ambulatory Visit
Admission: EM | Admit: 2020-04-24 | Discharge: 2020-04-24 | Disposition: A | Discharge status: Home / Self Care | Attending: Internal Medicine | Admitting: Internal Medicine

## 2020-04-24 DIAGNOSIS — S43401A Unspecified sprain of right shoulder joint, initial encounter: Secondary | ICD-10-CM

## 2020-04-24 MED ORDER — IBUPROFEN 600 MG PO TABS: 600.0000 mg | ORAL_TABLET | Freq: Four times a day (QID) | ORAL | 0 refills | Status: DC | PRN

## 2020-04-24 MED ORDER — METHOCARBAMOL 500 MG PO TABS: 500.0000 mg | ORAL_TABLET | Freq: Two times a day (BID) | ORAL | 0 refills | Status: DC

## 2020-04-24 NOTE — Discharge Instructions (Signed)
Gentle range of motion exercises Take medications as tolerated No lifting of weights over 20 pounds Heating pad for 10 minutes at a time with 15 minutes rest-4 cycles in the morning and 4 cycles in the evening.

## 2020-04-24 NOTE — ED Provider Notes (Signed)
[facility]    CSN: [id number] Arrival date & time: 04/24/20  0904      History   Chief Complaint Chief Complaint  Patient presents with  . Shoulder Pain    HPI Kristina Welch is a 29 y.o. female comes to the urgent care with right shoulder pain of about 1 week duration.  Patient around the time when she got her flu shot.  No trauma to the right shoulder.  Patient describes shoulder tightness worsened by movement.  She has not tried any over-the-counter medications.  She has been doing heavy lifting at home.  No numbness or tingling in the right hand.  No weakness in the right hand.  No bruising on the shoulder.   HPI  Past Medical History:  Diagnosis Date  . History of iron deficiency anemia     Patient Active Problem List   Diagnosis Date Noted  . Urticaria 06/05/2016  . DUB (dysfunctional uterine bleeding) 08/29/2015  . H/O iron deficiency anemia 08/29/2015  . Hidradenitis 08/29/2015    Past Surgical History:  Procedure Laterality Date  . CESAREAN SECTION    . cyst removed    . TONSILLECTOMY      OB History    Gravida  3   Para  2   Term      Preterm      AB      Living        SAB      TAB      Ectopic      Multiple      Live Births               Home Medications    Prior to Admission medications   Medication Sig Start Date End Date Taking? Authorizing Provider  ergocalciferol (VITAMIN D2) 1.25 MG (50000 UT) capsule 1 po weekly x 2 months, then monthly thereafter 04/17/20  Yes [provider]  ferrous sulfate 325 (65 FE) MG tablet Take 325 mg by mouth daily with breakfast.    [provider]  ibuprofen (ADVIL) 600 MG tablet Take 1 tablet (600 mg total) by mouth every 6 (six) hours as needed. 04/24/20   Dametria Tuzzolino, Britta Mccreedy, MD  methocarbamol (ROBAXIN) 500 MG tablet Take 1 tablet (500 mg total) by mouth 2 (two) times daily. 04/24/20   Merrilee Jansky, MD  fluticasone (FLONASE) 50 MCG/ACT nasal spray  Place 2 sprays into both nostrils daily. 10/25/19 04/24/20  Domenick Gong, MD    Family History Family History  Problem Relation Age of Onset  . Cancer Mother        remission  . Hypertension Father   . Healthy Daughter   . Healthy Son   . Cancer Maternal Grandmother        breast    Social History Social History   Tobacco Use  . Smoking status: Never Smoker  . Smokeless tobacco: Never Used  Vaping Use  . Vaping Use: Never used  Substance Use Topics  . Alcohol use: No  . Drug use: No     Allergies   Peanut-containing drug products   Review of Systems Review of Systems  Constitutional: Negative.   Gastrointestinal: Negative.   Musculoskeletal: Positive for arthralgias and myalgias. Negative for joint swelling, neck pain and neck stiffness.     Physical Exam Triage Vital Signs ED Triage Vitals  Enc Vitals Group     BP 04/24/20 0921 102/79     Pulse Rate 04/24/20  0921 63     Resp 04/24/20 0921 16     Temp 04/24/20 0921 98.5 F (36.9 C)     Temp Source 04/24/20 0921 Oral     SpO2 04/24/20 0921 100 %     Weight 04/24/20 0922 198 lb (89.8 kg)     Height 04/24/20 0922 5\' 3"  (1.6 m)     Head Circumference --      Peak Flow --      Pain Score 04/24/20 0923 9     Pain Loc --      Pain Edu? --      Excl. in GC? --    No data found.  Updated Vital Signs BP 102/79 (BP Location: Left Arm)   Pulse 63   Temp 98.5 F (36.9 C) (Oral)   Resp 16   Ht 5\' 3"  (1.6 m)   Wt 89.8 kg   LMP 03/28/2020   SpO2 100%   BMI 35.07 kg/m   Visual Acuity Right Eye Distance:   Left Eye Distance:   Bilateral Distance:    Right Eye Near:   Left Eye Near:    Bilateral Near:     Physical Exam Vitals and nursing note reviewed.  Constitutional:      General: She is not in acute distress.    Appearance: She is not ill-appearing or diaphoretic.  Cardiovascular:     Rate and Rhythm: Normal rate and regular rhythm.     Pulses: Normal pulses.     Heart sounds: Normal  heart sounds.  Abdominal:     General: Bowel sounds are normal.     Palpations: Abdomen is soft.  Musculoskeletal:        General: No deformity or signs of injury.     Comments: Range of motion around the right shoulder is limited by discomfort.  No bruising.  Minimal tenderness to palpation.  Neurological:     Mental Status: She is alert.      UC Treatments / Results  Labs (all labs ordered are listed, but only abnormal results are displayed) Labs Reviewed - No data to display  EKG   Radiology No results found.  Procedures Procedures (including critical care time)  Medications Ordered in UC Medications - No data to display  Initial Impression / Assessment and Plan / UC Course  I have reviewed the triage vital signs and the nursing notes.  Pertinent labs & imaging results that were available during my care of the patient were reviewed by me and considered in my medical decision making (see chart for details).     1.  Right shoulder sprain likely secondary to heavy lifting Ibuprofen as needed for pain Robaxin twice daily as needed for muscle relaxant Heat therapy Gentle range of motion exercises If symptoms worsens patient is advised to follow-up with [facility] for further evaluation and possibly imaging to evaluate the rotator cuff. Final Clinical Impressions(s) / UC Diagnoses   Final diagnoses:  Sprain of right shoulder, unspecified shoulder sprain type, initial encounter     Discharge Instructions     Gentle range of motion exercises Take medications as tolerated No lifting of weights over 20 pounds Heating pad for 10 minutes at a time with 15 minutes rest-4 cycles in the morning and 4 cycles in the evening.   ED Prescriptions    Medication Sig Dispense Auth. Provider   ibuprofen (ADVIL) 600 MG tablet Take 1 tablet (600 mg total) by mouth every 6 (six) hours as needed. 30  tablet Ashtynn Berke, Britta Mccreedy, MD   methocarbamol (ROBAXIN) 500 MG tablet Take 1  tablet (500 mg total) by mouth 2 (two) times daily. 20 tablet Lauralie Blacksher, Britta Mccreedy, MD     PDMP not reviewed this encounter.   Merrilee Jansky, MD 04/24/20 1116

## 2020-04-24 NOTE — ED Triage Notes (Signed)
Pt with right shoulder pain. Started after flu shot last week and was seen here for same but now feels like the pain has moved and is "a ball" on her anterior shoulder

## 2020-08-09 ENCOUNTER — Other Ambulatory Visit: Payer: Self-pay

## 2020-08-09 ENCOUNTER — Encounter: Payer: Self-pay | Admitting: Emergency Medicine

## 2020-08-09 ENCOUNTER — Ambulatory Visit
Admission: EM | Admit: 2020-08-09 | Discharge: 2020-08-09 | Disposition: A | Discharge status: Home / Self Care | Attending: Sports Medicine | Admitting: Sports Medicine

## 2020-08-09 DIAGNOSIS — J029 Acute pharyngitis, unspecified: Secondary | ICD-10-CM

## 2020-08-09 DIAGNOSIS — R5081 Fever presenting with conditions classified elsewhere: Secondary | ICD-10-CM

## 2020-08-09 DIAGNOSIS — Z283 Underimmunization status: Secondary | ICD-10-CM | POA: Insufficient documentation

## 2020-08-09 DIAGNOSIS — Z888 Allergy status to other drugs, medicaments and biological substances status: Secondary | ICD-10-CM | POA: Diagnosis not present

## 2020-08-09 DIAGNOSIS — J069 Acute upper respiratory infection, unspecified: Secondary | ICD-10-CM | POA: Diagnosis not present

## 2020-08-09 DIAGNOSIS — Z79899 Other long term (current) drug therapy: Secondary | ICD-10-CM | POA: Insufficient documentation

## 2020-08-09 DIAGNOSIS — U071 COVID-19: Secondary | ICD-10-CM | POA: Insufficient documentation

## 2020-08-09 DIAGNOSIS — R059 Cough, unspecified: Secondary | ICD-10-CM | POA: Diagnosis present

## 2020-08-09 NOTE — Discharge Instructions (Addendum)
Recommended getting a COVID test here in the office today.  We will send that to the hospital and I will take between 6 and 24 hours to come back.  In the interim of asked her to go ahead and isolate.  If she is negative she can come out of isolation.  If she is positive then she will need to go into quarantine and an updated plan will be called to her per the current CDC guidelines. She does not need a work note currently. Educational handout was provided. Supportive care, over-the-counter meds as needed, Tylenol Motrin for fever discomfort. Follow-up here as needed.

## 2020-08-09 NOTE — ED Triage Notes (Signed)
Patient c/o chills, fever, sinus pressure, bodyaches, and cough that started on Tuesday.

## 2020-08-09 NOTE — ED Provider Notes (Signed)
[facility]    CSN: [id number] Arrival date & time: 08/09/20  1709      History   Chief Complaint Chief Complaint  Patient presents with  . Covid Exposure  . Fever  . Cough    HPI Kristina Welch is a 30 y.o. female.   Pleasant 30 year old female who who presents for evaluation of the above issue.  She has had 2 to 3 days of cough, sore throat, fever, sneezing and myalgias.  No abdominal pain or urinary symptoms.  No chest pain shortness of breath.  She has had COVID exposure.  Her daughter was diagnosed here about 5 days ago with COVID.  She has had 1 vaccine and is due for her second shot later this month.  She has had her flu shot.  She is currently at home and is due to start her job in the next few weeks after she gets her second vaccine.  No red flag signs or symptoms elicited on history.     Past Medical History:  Diagnosis Date  . History of iron deficiency anemia     Patient Active Problem List   Diagnosis Date Noted  . Urticaria 06/05/2016  . DUB (dysfunctional uterine bleeding) 08/29/2015  . H/O iron deficiency anemia 08/29/2015  . Hidradenitis 08/29/2015    Past Surgical History:  Procedure Laterality Date  . CESAREAN SECTION    . cyst removed    . TONSILLECTOMY      OB History    Gravida  3   Para  2   Term      Preterm      AB      Living        SAB      IAB      Ectopic      Multiple      Live Births               Home Medications    Prior to Admission medications   Medication Sig Start Date End Date Taking? Authorizing Provider  ergocalciferol (VITAMIN D2) 1.25 MG (50000 UT) capsule 1 po weekly x 2 months, then monthly thereafter 04/17/20   [provider]  ferrous sulfate 325 (65 FE) MG tablet Take 325 mg by mouth daily with breakfast.    [provider]  ibuprofen (ADVIL) 600 MG tablet Take 1 tablet (600 mg total) by mouth every 6 (six) hours as needed. 04/24/20   Lamptey, Britta Mccreedy,  MD  methocarbamol (ROBAXIN) 500 MG tablet Take 1 tablet (500 mg total) by mouth 2 (two) times daily. 04/24/20   Merrilee Jansky, MD  fluticasone (FLONASE) 50 MCG/ACT nasal spray Place 2 sprays into both nostrils daily. 10/25/19 04/24/20  Domenick Gong, MD    Family History Family History  Problem Relation Age of Onset  . Cancer Mother        remission  . Hypertension Father   . Healthy Daughter   . Healthy Son   . Cancer Maternal Grandmother        breast    Social History Social History   Tobacco Use  . Smoking status: Never Smoker  . Smokeless tobacco: Never Used  Vaping Use  . Vaping Use: Never used  Substance Use Topics  . Alcohol use: No  . Drug use: No     Allergies   Peanut-containing drug products and Zyrtec [cetirizine]   Review of Systems Review of Systems  Constitutional: Positive for fever. Negative  for activity change, appetite change, chills, diaphoresis and fatigue.  HENT: Positive for congestion, sneezing and sore throat. Negative for ear discharge, ear pain, postnasal drip, sinus pressure and sinus pain.   Eyes: Negative for pain.  Respiratory: Positive for cough. Negative for chest tightness, shortness of breath, wheezing and stridor.   Cardiovascular: Negative for chest pain and palpitations.  Gastrointestinal: Positive for diarrhea. Negative for abdominal pain, nausea and vomiting.  Genitourinary: Negative for dysuria.  Skin: Negative for color change, pallor, rash and wound.  Neurological: Positive for headaches. Negative for dizziness, facial asymmetry, weakness, light-headedness and numbness.     Physical Exam Triage Vital Signs ED Triage Vitals  Enc Vitals Group     BP 08/09/20 1743 105/79     Pulse Rate 08/09/20 1743 88     Resp 08/09/20 1743 14     Temp 08/09/20 1743 98.8 F (37.1 C)     Temp Source 08/09/20 1743 Oral     SpO2 08/09/20 1743 100 %     Weight 08/09/20 1739 197 lb 15.6 oz (89.8 kg)     Height 08/09/20 1739 5\' 3"   (1.6 m)     Head Circumference --      Peak Flow --      Pain Score 08/09/20 1738 0     Pain Loc --      Pain Edu? --      Excl. in GC? --    No data found.  Updated Vital Signs BP 105/79 (BP Location: Right Arm)   Pulse 88   Temp 98.8 F (37.1 C) (Oral)   Resp 14   Ht 5\' 3"  (1.6 m)   Wt 89.8 kg   LMP 07/21/2020   SpO2 100%   Breastfeeding No   BMI 35.07 kg/m   Visual Acuity Right Eye Distance:   Left Eye Distance:   Bilateral Distance:    Right Eye Near:   Left Eye Near:    Bilateral Near:     Physical Exam Vitals and nursing note reviewed.  Constitutional:      General: She is not in acute distress.    Appearance: Normal appearance. She is not ill-appearing or toxic-appearing.  HENT:     Head: Normocephalic and atraumatic.     Right Ear: Tympanic membrane normal.     Left Ear: Tympanic membrane normal.     Nose: No congestion or rhinorrhea.     Mouth/Throat:     Mouth: Mucous membranes are moist.     Pharynx: Posterior oropharyngeal erythema present. No oropharyngeal exudate.  Eyes:     Extraocular Movements: Extraocular movements intact.     Conjunctiva/sclera: Conjunctivae normal.     Pupils: Pupils are equal, round, and reactive to light.  Cardiovascular:     Rate and Rhythm: Normal rate and regular rhythm.     Pulses: Normal pulses.     Heart sounds: Normal heart sounds. No murmur heard. No friction rub. No gallop.   Pulmonary:     Effort: Pulmonary effort is normal. No respiratory distress.     Breath sounds: Normal breath sounds. No stridor. No wheezing, rhonchi or rales.  Musculoskeletal:     Cervical back: Normal range of motion and neck supple. No tenderness.  Lymphadenopathy:     Cervical: Cervical adenopathy present.  Skin:    General: Skin is warm and dry.     Capillary Refill: Capillary refill takes less than 2 seconds.  Neurological:     General: No focal deficit  present.     Mental Status: She is alert and oriented to person, place,  and time.      UC Treatments / Results  Labs (all labs ordered are listed, but only abnormal results are displayed) Labs Reviewed  SARS CORONAVIRUS 2 (TAT 6-24 HRS)    EKG   Radiology No results found.  Procedures Procedures (including critical care time)  Medications Ordered in UC Medications - No data to display  Initial Impression / Assessment and Plan / UC Course  I have reviewed the triage vital signs and the nursing notes.  Pertinent labs & imaging results that were available during my care of the patient were reviewed by me and considered in my medical decision making (see chart for details).   Clinical impression: 30 year old female with 2 to 3 days of the above symptoms.  This includes cough, sore throat, fever, sneezing, and myalgias.  She has had COVID exposure.  Treatment plan: 1.  The findings and treatment plan were discussed in detail with the patient.  Patient was in agreement. 2.  Recommended getting a COVID test here in the office today.  We will send that to the hospital and I will take between 6 and 24 hours to come back.  In the interim of asked her to go ahead and isolate.  If she is negative she can come out of isolation.  If she is positive then she will need to go into quarantine and an updated plan will be called to her per the current CDC guidelines. 3.  She does not need a work note currently. 4.  Educational handout was provided. 5.  Supportive care, over-the-counter meds as needed, Tylenol Motrin for fever discomfort. 6.  Follow-up here as needed.    Final Clinical Impressions(s) / UC Diagnoses   Final diagnoses:  Viral URI with cough  Fever in other diseases  Acute pharyngitis, unspecified etiology     Discharge Instructions     Recommended getting a COVID test here in the office today.  We will send that to the hospital and I will take between 6 and 24 hours to come back.  In the interim of asked her to go ahead and isolate.  If she  is negative she can come out of isolation.  If she is positive then she will need to go into quarantine and an updated plan will be called to her per the current CDC guidelines. She does not need a work note currently. Educational handout was provided. Supportive care, over-the-counter meds as needed, Tylenol Motrin for fever discomfort. Follow-up here as needed.    ED Prescriptions    None     PDMP not reviewed this encounter.   Delton See, MD 08/09/20 1816

## 2020-08-10 LAB — SARS CORONAVIRUS 2 (TAT 6-24 HRS): SARS Coronavirus 2: POSITIVE — AB

## 2020-09-05 ENCOUNTER — Encounter: Payer: Self-pay | Admitting: Emergency Medicine

## 2020-09-05 ENCOUNTER — Other Ambulatory Visit: Payer: Self-pay

## 2020-09-05 ENCOUNTER — Ambulatory Visit
Admission: EM | Admit: 2020-09-05 | Discharge: 2020-09-05 | Disposition: A | Discharge status: Home / Self Care | Attending: Emergency Medicine | Admitting: Emergency Medicine

## 2020-09-05 DIAGNOSIS — J3489 Other specified disorders of nose and nasal sinuses: Secondary | ICD-10-CM | POA: Diagnosis not present

## 2020-09-05 DIAGNOSIS — H66002 Acute suppurative otitis media without spontaneous rupture of ear drum, left ear: Secondary | ICD-10-CM | POA: Diagnosis not present

## 2020-09-05 MED ORDER — BENZONATATE 100 MG PO CAPS: 200.0000 mg | ORAL_CAPSULE | Freq: Three times a day (TID) | ORAL | 0 refills | Status: DC

## 2020-09-05 MED ORDER — IPRATROPIUM BROMIDE 0.06 % NA SOLN: 2.0000 | Freq: Four times a day (QID) | NASAL | 12 refills | Status: DC

## 2020-09-05 MED ORDER — PROMETHAZINE-DM 6.25-15 MG/5ML PO SYRP: 5.0000 mL | ORAL_SOLUTION | Freq: Four times a day (QID) | ORAL | 0 refills | Status: DC | PRN

## 2020-09-05 NOTE — ED Provider Notes (Signed)
[facility]    CSN: [id number] Arrival date & time: 09/05/20  0900      History   Chief Complaint Chief Complaint  Patient presents with  . Cough  . sneezing  . Ear Fullness    left    HPI Kristina Welch is a 30 y.o. female.   HPI   30 year old female here for evaluation of cough, left ear fullness, sneezing, and headache x5 days.  Patient reports that she had COVID back on August 09, 2020 but that illness completely resolved.  Patient was tested again for COVID 2 days ago and had a negative PCR.  Patient is here today because her work is requiring her to be evaluated due to her continued cough.  Patient states that she did have a mildly elevated temp of 100 yesterday but that is the only time during her course of illness.  Patient reports that she has had a runny nose with clear nasal discharge, and she is had pressure behind her right eye.  Patient denies shortness of breath or wheezing and has not had any GI complaints.  Past Medical History:  Diagnosis Date  . History of iron deficiency anemia     Patient Active Problem List   Diagnosis Date Noted  . Urticaria 06/05/2016  . DUB (dysfunctional uterine bleeding) 08/29/2015  . H/O iron deficiency anemia 08/29/2015  . Hidradenitis 08/29/2015    Past Surgical History:  Procedure Laterality Date  . CESAREAN SECTION    . cyst removed    . TONSILLECTOMY      OB History    Gravida  3   Para  2   Term      Preterm      AB      Living        SAB      IAB      Ectopic      Multiple      Live Births               Home Medications    Prior to Admission medications   Medication Sig Start Date End Date Taking? Authorizing Provider  benzonatate (TESSALON) 100 MG capsule Take 2 capsules (200 mg total) by mouth every 8 (eight) hours. 09/05/20  Yes Becky Augusta, NP  ergocalciferol (VITAMIN D2) 1.25 MG (50000 UT) capsule 1 po weekly x 2 months, then monthly thereafter 04/17/20  Yes  [provider]  ferrous sulfate 325 (65 FE) MG tablet Take 325 mg by mouth daily with breakfast.   Yes [provider]  ipratropium (ATROVENT) 0.06 % nasal spray Place 2 sprays into both nostrils 4 (four) times daily. 09/05/20  Yes Becky Augusta, NP  promethazine-dextromethorphan (PROMETHAZINE-DM) 6.25-15 MG/5ML syrup Take 5 mLs by mouth 4 (four) times daily as needed. 09/05/20  Yes Becky Augusta, NP  fluticasone [facility]) 50 MCG/ACT nasal spray Place 2 sprays into both nostrils daily. 10/25/19 04/24/20  Domenick Gong, MD    Family History Family History  Problem Relation Age of Onset  . Cancer Mother        remission  . Hypertension Mother   . Hypertension Father   . Healthy Daughter   . Healthy Son   . Cancer Maternal Grandmother        breast    Social History Social History   Tobacco Use  . Smoking status: Never Smoker  . Smokeless tobacco: Never Used  Vaping Use  . Vaping Use: Never used  Substance  Use Topics  . Alcohol use: No  . Drug use: No     Allergies   Peanut-containing drug products and Zyrtec [cetirizine]   Review of Systems Review of Systems  Constitutional: Positive for fever. Negative for activity change and appetite change.  HENT: Positive for ear pain, rhinorrhea, sinus pressure and sneezing. Negative for sinus pain and sore throat.   Respiratory: Positive for cough. Negative for shortness of breath.   Skin: Negative for rash.  Neurological: Positive for headaches.  Hematological: Negative.   Psychiatric/Behavioral: Negative.      Physical Exam Triage Vital Signs ED Triage Vitals  Enc Vitals Group     BP 09/05/20 0926 119/71     Pulse Rate 09/05/20 0926 68     Resp 09/05/20 0926 18     Temp 09/05/20 0926 98.2 F (36.8 C)     Temp Source 09/05/20 0926 Oral     SpO2 09/05/20 0926 100 %     Weight 09/05/20 0925 198 lb (89.8 kg)     Height 09/05/20 0925 5\' 3"  (1.6 m)     Head Circumference --      Peak Flow --      Pain  Score 09/05/20 0925 0     Pain Loc --      Pain Edu? --      Excl. in GC? --    No data found.  Updated Vital Signs BP 119/71 (BP Location: Left Arm)   Pulse 68   Temp 98.2 F (36.8 C) (Oral)   Resp 18   Ht 5\' 3"  (1.6 m)   Wt 198 lb (89.8 kg)   LMP 08/20/2020 (Exact Date)   SpO2 100%   BMI 35.07 kg/m   Visual Acuity Right Eye Distance:   Left Eye Distance:   Bilateral Distance:    Right Eye Near:   Left Eye Near:    Bilateral Near:     Physical Exam Vitals and nursing note reviewed.  Constitutional:      General: She is not in acute distress.    Appearance: Normal appearance. She is not ill-appearing.  HENT:     Head: Normocephalic and atraumatic.     Right Ear: Tympanic membrane, ear canal and external ear normal.     Left Ear: Ear canal and external ear normal.     Ears:     Comments: Left tympanic membrane is cherry red with mild injection and a loss of landmarks.    Nose: Congestion and rhinorrhea present.     Comments: Nasal mucosa demonstrates mild edema with pale mucous membranes and clear nasal discharge.  Frontal and maxillary sinuses are nontender to percussion bilaterally.    Mouth/Throat:     Mouth: Mucous membranes are moist.     Pharynx: Oropharynx is clear. No posterior oropharyngeal erythema.  Cardiovascular:     Rate and Rhythm: Normal rate and regular rhythm.     Pulses: Normal pulses.     Heart sounds: Normal heart sounds. No murmur heard. No gallop.   Pulmonary:     Effort: Pulmonary effort is normal.     Breath sounds: Normal breath sounds. No wheezing or rales.  Musculoskeletal:     Cervical back: Normal range of motion and neck supple.  Lymphadenopathy:     Cervical: No cervical adenopathy.  Skin:    General: Skin is warm and dry.     Capillary Refill: Capillary refill takes less than 2 seconds.     Findings: No erythema  or rash.  Neurological:     General: No focal deficit present.     Mental Status: She is alert and oriented to  person, place, and time.  Psychiatric:        Mood and Affect: Mood normal.        Behavior: Behavior normal.        Thought Content: Thought content normal.        Judgment: Judgment normal.      UC Treatments / Results  Labs (all labs ordered are listed, but only abnormal results are displayed) Labs Reviewed - No data to display  EKG   Radiology No results found.  Procedures Procedures (including critical care time)  Medications Ordered in UC Medications - No data to display  Initial Impression / Assessment and Plan / UC Course  I have reviewed the triage vital signs and the nursing notes.  Pertinent labs & imaging results that were available during my care of the patient were reviewed by me and considered in my medical decision making (see chart for details).   Patient is a very pleasant 30 year old female who works as a Clinical biochemist for [employer] who is here for evaluation of left ear fullness and URI complaints.  Patient reports that she has had symptoms for 5 days.  She was tested for Covid and had a negative PCR 2 days ago.  She is here today because her work needs her to be evaluated for the continued cough.  Patient's upper respiratory exam reveals pale mucous membranes with mild edema and clear discharge.  Left tympanic membrane is cherry red and injected.  Lungs are clear to auscultation all fields.  Suspect patient's cough is being driven by her clear nasal discharge, which based upon her pale mucous membranes may be allergy mediated.  We will treat patient's left otitis media with Augmentin twice daily for 10 days, give her Atrovent nasal spray to help with her rhinorrhea, Tessalon Perles and Promethazine DM for cough and congestion.  Patient is cleared to return to work tomorrow.   Final Clinical Impressions(s) / UC Diagnoses   Final diagnoses:  Non-recurrent acute suppurative otitis media of left ear without spontaneous rupture of tympanic membrane  Rhinorrhea      Discharge Instructions     Use the Atrovent nasal spray, 2 squirts in each nostril every 6 hours, as needed for runny nose and postnasal drip.  Use the Tessalon Perles every 8 hours during the day.  Take them with a small sip of water.  They may give you some numbness to the base of your tongue or a metallic taste in your mouth, this is normal.  Use the Promethazine DM cough syrup at bedtime for cough and congestion.  It will make you drowsy so do not take it during the day.  Return for reevaluation or see your primary care provider for any new or worsening symptoms.    ED Prescriptions    Medication Sig Dispense Auth. Provider   benzonatate (TESSALON) 100 MG capsule Take 2 capsules (200 mg total) by mouth every 8 (eight) hours. 21 capsule Becky Augusta, NP   ipratropium (ATROVENT) 0.06 % nasal spray Place 2 sprays into both nostrils 4 (four) times daily. 15 mL Becky Augusta, NP   promethazine-dextromethorphan (PROMETHAZINE-DM) 6.25-15 MG/5ML syrup Take 5 mLs by mouth 4 (four) times daily as needed. 118 mL Becky Augusta, NP     PDMP not reviewed this encounter.   Becky Augusta, NP 09/05/20 [phone]

## 2020-09-05 NOTE — Discharge Instructions (Addendum)

## 2020-09-05 NOTE — ED Triage Notes (Addendum)
Patient in today c/o cough, ear fullness (left), sneezing and headache x 5 days. Patient had covid on 08/09/20. Patient had a PCR covid test on 09/03/20 that was negative. Patient has had the covid vaccines, no booster. Patient has taken OTC Tylenol sinus and headache.

## 2020-09-07 ENCOUNTER — Telehealth (HOSPITAL_COMMUNITY): Payer: Self-pay | Admitting: Emergency Medicine

## 2020-09-07 MED ORDER — AMOXICILLIN-POT CLAVULANATE 875-125 MG PO TABS: 1.0000 | ORAL_TABLET | Freq: Two times a day (BID) | ORAL | 0 refills | Status: AC

## 2020-09-07 NOTE — Telephone Encounter (Signed)
Patient states her pharmacy never received a prescription for Augmentin. She says her notes online state she should have received 10 days worth, which I do see in Jeremy's note. She is requesting this prescription  Reviewed  By Dr. Leonides Grills, who approved prescription to be sent.  Patient updated, verified pharmacy

## 2020-11-20 ENCOUNTER — Ambulatory Visit
Admission: EM | Admit: 2020-11-20 | Discharge: 2020-11-20 | Disposition: A | Discharge status: Home / Self Care | Payer: 59 | Attending: Sports Medicine | Admitting: Sports Medicine

## 2020-11-20 ENCOUNTER — Other Ambulatory Visit: Payer: Self-pay

## 2020-11-20 ENCOUNTER — Encounter: Payer: Self-pay | Admitting: Emergency Medicine

## 2020-11-20 DIAGNOSIS — R059 Cough, unspecified: Secondary | ICD-10-CM | POA: Insufficient documentation

## 2020-11-20 DIAGNOSIS — Z20822 Contact with and (suspected) exposure to covid-19: Secondary | ICD-10-CM | POA: Insufficient documentation

## 2020-11-20 DIAGNOSIS — J069 Acute upper respiratory infection, unspecified: Secondary | ICD-10-CM | POA: Diagnosis not present

## 2020-11-20 DIAGNOSIS — H938X2 Other specified disorders of left ear: Secondary | ICD-10-CM

## 2020-11-20 DIAGNOSIS — J3489 Other specified disorders of nose and nasal sinuses: Secondary | ICD-10-CM | POA: Diagnosis not present

## 2020-11-20 DIAGNOSIS — Z79899 Other long term (current) drug therapy: Secondary | ICD-10-CM | POA: Diagnosis not present

## 2020-11-20 DIAGNOSIS — R509 Fever, unspecified: Secondary | ICD-10-CM | POA: Diagnosis not present

## 2020-11-20 LAB — RESP PANEL BY RT-PCR (FLU A&B, COVID) ARPGX2
Influenza A by PCR: NEGATIVE
Influenza B by PCR: NEGATIVE
SARS Coronavirus 2 by RT PCR: NEGATIVE

## 2020-11-20 NOTE — ED Triage Notes (Signed)
Pt c/o cough, low grade fever, nasal congestion. Started this morning.

## 2020-11-20 NOTE — Discharge Instructions (Signed)
Your respiratory panel revealed a negative COVID test, negative influenza A test, and negative influenza B test. Educational handouts provided. I provided you a work note and you can go back to work tomorrow.  You should wear a mask.  Otherwise no restrictions. Tylenol or Motrin for any fever or discomfort. Plenty of rest and plenty of fluids. If your symptoms were to persist you should see your primary care provider.  If they worsen you should go to the ER.

## 2020-11-20 NOTE — ED Provider Notes (Signed)
[facility]    CSN: [id number] Arrival date & time: 11/20/20  1715      History   Chief Complaint Chief Complaint  Patient presents with  . Cough    HPI Kristina Welch is a 30 y.o. female.   Patient is a pleasant 30 year old female who presents for evaluation of the above issue.  Her primary care provider is [facility] in [place].  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  She reports that her symptoms began this morning and that her workplace wanted her seen and evaluated before returning to work.  She reports cough, sneezing, nasal congestion, rhinorrhea, and left ear pressure since this morning.  Her T-max is 99.9.  Her cough is nonproductive.  She has been vaccinated against COVID and has not received her booster but is scheduled to do so next month.  She said that she had COVID in January 2022 so she is not eligible for booster until June.  She has no recent COVID exposure.  She has no influenza exposure as well.  She denies any chest pain or shortness of breath.  No nausea vomiting or diarrhea.  No abdominal or urinary symptoms.  No red flag signs or symptoms elicited on history.       Past Medical History:  Diagnosis Date  . History of iron deficiency anemia     Patient Active Problem List   Diagnosis Date Noted  . Urticaria 06/05/2016  . DUB (dysfunctional uterine bleeding) 08/29/2015  . H/O iron deficiency anemia 08/29/2015  . Hidradenitis 08/29/2015    Past Surgical History:  Procedure Laterality Date  . CESAREAN SECTION    . cyst removed    . TONSILLECTOMY      OB History    Gravida  3   Para  2   Term      Preterm      AB      Living        SAB      IAB      Ectopic      Multiple      Live Births               Home Medications    Prior to Admission medications   Medication Sig Start Date End Date Taking? Authorizing Provider  benzonatate (TESSALON) 100 MG capsule Take 2 capsules  (200 mg total) by mouth every 8 (eight) hours. 09/05/20   Becky Augusta, NP  ergocalciferol (VITAMIN D2) 1.25 MG (50000 UT) capsule 1 po weekly x 2 months, then monthly thereafter 04/17/20   [provider]  ferrous sulfate 325 (65 FE) MG tablet Take 325 mg by mouth daily with breakfast.    [provider]  ipratropium (ATROVENT) 0.06 % nasal spray Place 2 sprays into both nostrils 4 (four) times daily. 09/05/20   Becky Augusta, NP  promethazine-dextromethorphan (PROMETHAZINE-DM) 6.25-15 MG/5ML syrup Take 5 mLs by mouth 4 (four) times daily as needed. Patient taking differently: Take 5 mLs by mouth 4 (four) times daily as needed. 09/05/20   Becky Augusta, NP  fluticasone [facility]) 50 MCG/ACT nasal spray Place 2 sprays into both nostrils daily. 10/25/19 04/24/20  Domenick Gong, MD    Family History Family History  Problem Relation Age of Onset  . Cancer Mother        remission  . Hypertension Mother   . Hypertension Father   . Healthy Daughter   . Healthy Son   .  Cancer Maternal Grandmother        breast    Social History Social History   Tobacco Use  . Smoking status: Never Smoker  . Smokeless tobacco: Never Used  Vaping Use  . Vaping Use: Never used  Substance Use Topics  . Alcohol use: No  . Drug use: No     Allergies   Peanut-containing drug products and Zyrtec [cetirizine]   Review of Systems Review of Systems  Constitutional: Positive for fever. Negative for activity change, appetite change, chills, diaphoresis and fatigue.  HENT: Positive for congestion, ear pain, postnasal drip, rhinorrhea and sneezing. Negative for sinus pressure, sinus pain and sore throat.   Eyes: Negative.  Negative for pain.  Respiratory: Positive for cough. Negative for chest tightness, shortness of breath, wheezing and stridor.   Cardiovascular: Negative.  Negative for chest pain and palpitations.  Gastrointestinal: Negative for abdominal pain, constipation, diarrhea, nausea  and vomiting.  Genitourinary: Negative.  Negative for dysuria.  Musculoskeletal: Negative.  Negative for arthralgias, back pain, myalgias, neck pain and neck stiffness.  Skin: Negative.  Negative for color change, pallor, rash and wound.  Neurological: Negative for dizziness, syncope, light-headedness and headaches.  All other systems reviewed and are negative.    Physical Exam Triage Vital Signs ED Triage Vitals  Enc Vitals Group     BP 11/20/20 1732 119/86     Pulse Rate 11/20/20 1732 (!) 104     Resp 11/20/20 1732 18     Temp 11/20/20 1732 99.9 F (37.7 C)     Temp Source 11/20/20 1732 Oral     SpO2 11/20/20 1732 100 %     Weight 11/20/20 1729 197 lb 15.6 oz (89.8 kg)     Height 11/20/20 1729 5\' 3"  (1.6 m)     Head Circumference --      Peak Flow --      Pain Score 11/20/20 1728 0     Pain Loc --      Pain Edu? --      Excl. in GC? --    No data found.  Updated Vital Signs BP 119/86 (BP Location: Left Arm)   Pulse (!) 104   Temp 99.9 F (37.7 C) (Oral)   Resp 18   Ht 5\' 3"  (1.6 m)   Wt 89.8 kg   LMP 11/18/2020 (Approximate)   SpO2 100%   BMI 35.07 kg/m   Visual Acuity Right Eye Distance:   Left Eye Distance:   Bilateral Distance:    Right Eye Near:   Left Eye Near:    Bilateral Near:     Physical Exam Vitals and nursing note reviewed.  Constitutional:      General: She is not in acute distress.    Appearance: Normal appearance. She is not ill-appearing, toxic-appearing or diaphoretic.  HENT:     Head: Normocephalic and atraumatic.     Right Ear: Tympanic membrane normal.     Left Ear: Tympanic membrane normal.     Nose: Congestion and rhinorrhea present.     Mouth/Throat:     Mouth: Mucous membranes are moist.     Pharynx: No oropharyngeal exudate or posterior oropharyngeal erythema.  Eyes:     General: No scleral icterus.       Right eye: No discharge.        Left eye: No discharge.     Extraocular Movements: Extraocular movements intact.      Conjunctiva/sclera: Conjunctivae normal.     Pupils: Pupils  are equal, round, and reactive to light.  Cardiovascular:     Rate and Rhythm: Normal rate and regular rhythm.     Pulses: Normal pulses.     Heart sounds: Normal heart sounds. No murmur heard. No friction rub. No gallop.   Pulmonary:     Effort: Pulmonary effort is normal. No respiratory distress.     Breath sounds: Normal breath sounds. No stridor. No wheezing, rhonchi or rales.  Musculoskeletal:     Cervical back: Normal range of motion and neck supple. No rigidity or tenderness.  Lymphadenopathy:     Cervical: Cervical adenopathy present.  Skin:    General: Skin is warm.     Capillary Refill: Capillary refill takes less than 2 seconds.     Coloration: Skin is not jaundiced.     Findings: No bruising, erythema, lesion or rash.  Neurological:     General: No focal deficit present.     Mental Status: She is alert and oriented to person, place, and time.      UC Treatments / Results  Labs (all labs ordered are listed, but only abnormal results are displayed) Labs Reviewed  RESP PANEL BY RT-PCR (FLU A&B, COVID) [id number]    EKG   Radiology No results found.  Procedures Procedures (including critical care time)  Medications Ordered in UC Medications - No data to display  Initial Impression / Assessment and Plan / UC Course  I have reviewed the triage vital signs and the nursing notes.  Pertinent labs & imaging results that were available during my care of the patient were reviewed by me and considered in my medical decision making (see chart for details).   Clinical impression: Cough, nasal congestion, rhinorrhea, left ear pressure, and low-grade fever that began this morning.  Treatment plan: 1.  The findings and treatment plan were discussed in detail with the patient.  Patient was in agreement. 2.  Recommended getting a respiratory panel.  Influenza A, influenza B, and COVID were all negative.  You may  have a another viral pathogen that is causing your symptoms. 3.  Educational handouts provided. 4.  Work note provided. 5.  Tylenol or Motrin for any fever or discomfort.  Plenty of rest plenty of fluids. 6.  If symptoms were to persist patient should see her primary care provider. 7.  If symptoms were to worsen then I advised the patient to go to the ER. 8.  Patient will follow up with [place] as needed.  She was stable upon discharge.    Final Clinical Impressions(s) / UC Diagnoses   Final diagnoses:  Cough  Upper respiratory tract infection, unspecified type  Fever, unspecified fever cause  Rhinorrhea  Ear pressure, left     Discharge Instructions     Your respiratory panel revealed a negative COVID test, negative influenza A test, and negative influenza B test. Educational handouts provided. I provided you a work note and you can go back to work tomorrow.  You should wear a mask.  Otherwise no restrictions. Tylenol or Motrin for any fever or discomfort. Plenty of rest and plenty of fluids. If your symptoms were to persist you should see your primary care provider.  If they worsen you should go to the ER.    ED Prescriptions    None     PDMP not reviewed this encounter.   Delton See, MD 11/20/20 [id number]

## 2021-05-16 ENCOUNTER — Emergency Department
Admission: EM | Admit: 2021-05-16 | Discharge: 2021-05-17 | Disposition: A | Discharge status: Home / Self Care | Payer: 59 | Attending: Emergency Medicine | Admitting: Emergency Medicine

## 2021-05-16 ENCOUNTER — Other Ambulatory Visit: Payer: Self-pay

## 2021-05-16 DIAGNOSIS — N764 Abscess of vulva: Secondary | ICD-10-CM | POA: Insufficient documentation

## 2021-05-16 DIAGNOSIS — Z91010 Allergy to peanuts: Secondary | ICD-10-CM | POA: Insufficient documentation

## 2021-05-16 MED ORDER — LIDOCAINE HCL (PF) 1 % IJ SOLN
5.0000 mL | Freq: Once | INTRAMUSCULAR | Status: AC
  Administered 2021-05-16: 5 mL
  Filled 2021-05-16: qty 5

## 2021-05-16 MED ORDER — SULFAMETHOXAZOLE-TRIMETHOPRIM 800-160 MG PO TABS: 1.0000 | ORAL_TABLET | Freq: Two times a day (BID) | ORAL | 0 refills | Status: DC

## 2021-05-16 MED ORDER — ACETAMINOPHEN 325 MG PO TABS: 650.0000 mg | ORAL_TABLET | Freq: Once | ORAL | Status: AC

## 2021-05-16 MED ORDER — BUPIVACAINE HCL (PF) 0.5 % IJ SOLN
10.0000 mL | Freq: Once | INTRAMUSCULAR | Status: AC
  Administered 2021-05-16: 10 mL
  Filled 2021-05-16: qty 10

## 2021-05-16 MED ORDER — ACETAMINOPHEN 325 MG PO TABS
ORAL_TABLET | ORAL | Status: AC
  Administered 2021-05-16: 650 mg via ORAL
  Filled 2021-05-16: qty 2

## 2021-05-16 NOTE — ED Triage Notes (Signed)
Pt with hx of hidradenitis and states is having a flare up. States has swelling and pain to left labia since yesterday. States has not drained since, and has had slight fever.

## 2021-05-16 NOTE — ED Provider Notes (Signed)
[facility] Emergency Department Provider Note ____________________________________________  Time seen: 2219  I have reviewed the triage vital signs and the nursing notes.  HISTORY  Chief Complaint  Abscess   HPI Kristina Welch is a 30 y.o. female with a history of at [facility], presents to the ED with a vulvar abscess.  She reports swelling to the left labia since yesterday.  She denies any spontaneous drainage but reports increased tenderness and swelling.  She denies any fevers or chills or sweats.  Past Medical History:  Diagnosis Date   History of iron deficiency anemia     Patient Active Problem List   Diagnosis Date Noted   Urticaria 06/05/2016   DUB (dysfunctional uterine bleeding) 08/29/2015   H/O iron deficiency anemia 08/29/2015   Hidradenitis 08/29/2015    Past Surgical History:  Procedure Laterality Date   CESAREAN SECTION     cyst removed     TONSILLECTOMY      Prior to Admission medications   Medication Sig Start Date End Date Taking? Authorizing Provider  sulfamethoxazole-trimethoprim (BACTRIM DS) 800-160 MG tablet Take 1 tablet by mouth 2 (two) times daily. 05/16/21  Yes Farhana Fellows, Charlesetta Ivory, PA-C  benzonatate (TESSALON) 100 MG capsule Take 2 capsules (200 mg total) by mouth every 8 (eight) hours. 09/05/20   Becky Augusta, NP  ergocalciferol (VITAMIN D2) 1.25 MG (50000 UT) capsule 1 po weekly x 2 months, then monthly thereafter 04/17/20   [provider]  ferrous sulfate 325 (65 FE) MG tablet Take 325 mg by mouth daily with breakfast.    [provider]  ipratropium (ATROVENT) 0.06 % nasal spray Place 2 sprays into both nostrils 4 (four) times daily. 09/05/20   Becky Augusta, NP  promethazine-dextromethorphan (PROMETHAZINE-DM) 6.25-15 MG/5ML syrup Take 5 mLs by mouth 4 (four) times daily as needed. Patient taking differently: Take 5 mLs by mouth 4 (four) times daily as needed. 09/05/20   Becky Augusta, NP  fluticasone  [facility]) 50 MCG/ACT nasal spray Place 2 sprays into both nostrils daily. 10/25/19 04/24/20  Domenick Gong, MD    Allergies Peanut-containing drug products and Zyrtec [cetirizine]  Family History  Problem Relation Age of Onset   Cancer Mother        remission   Hypertension Mother    Hypertension Father    Healthy Daughter    Healthy Son    Cancer Maternal Grandmother        breast    Social History Social History   Tobacco Use   Smoking status: Never   Smokeless tobacco: Never  Vaping Use   Vaping Use: Never used  Substance Use Topics   Alcohol use: No   Drug use: No    Review of Systems  Constitutional: Negative for fever. Cardiovascular: Negative for chest pain. Respiratory: Negative for shortness of breath. Gastrointestinal: Negative for abdominal pain, vomiting and diarrhea. Genitourinary: Negative for dysuria. Musculoskeletal: Negative for back pain. Skin: Negative for rash.  Left vulvar abscess as above. Neurological: Negative for headaches, focal weakness or numbness. ____________________________________________  PHYSICAL EXAM:  VITAL SIGNS: ED Triage Vitals [05/16/21 2037]  Enc Vitals Group     BP (!) 136/96     Pulse Rate (!) 102     Resp 20     Temp 100.1 F (37.8 C)     Temp Source Oral     SpO2 100 %     Weight 193 lb (87.5 kg)     Height 5\' 3"  (1.6 m)  Head Circumference      Peak Flow      Pain Score 10     Pain Loc      Pain Edu?      Excl. in GC?     Constitutional: Alert and oriented. Well appearing and in no distress. Head: Normocephalic and atraumatic. Eyes: Conjunctivae are normal. Normal extraocular movements Cardiovascular: Normal rate, regular rhythm. Normal distal pulses. Respiratory: Normal respiratory effort. No wheezes/rales/rhonchi. Gastrointestinal: Soft and nontender. No distention. GU: Normal external genitalia except for a large subcutaneous cystic lesion to the left mons and labia.  No pointing, purulence, or  punctum is noted. Musculoskeletal: Nontender with normal range of motion in all extremities.  Neurologic:  Normal gait without ataxia. Normal speech and language. No gross focal neurologic deficits are appreciated. Skin:  Skin is warm, dry and intact. No rash noted. ____________________________________________    {LABS (pertinent positives/negatives)  ____________________________________________  {EKG  ____________________________________________   RADIOLOGY Official radiology report(s): No results found. ____________________________________________  PROCEDURES  Bactrim DS 1 p.o.  Marland Kitchen.Incision and Drainage  Date/Time: 05/16/2021 11:40 PM Performed by: Lissa Hoard, PA-C Authorized by: Lissa Hoard, PA-C   Consent:    Consent obtained:  Verbal   Consent given by:  Patient   Risks, benefits, and alternatives were discussed: yes     Risks discussed:  Bleeding, incomplete drainage and pain   Alternatives discussed:  Alternative treatment Universal protocol:    Procedure explained and questions answered to patient or proxy's satisfaction: yes     Site/side marked: yes     Patient identity confirmed:  Verbally with patient Location:    Type:  Abscess   Size:  4   Location:  Anogenital   Anogenital location:  Vulva Pre-procedure details:    Skin preparation:  Povidone-iodine Sedation:    Sedation type:  None Anesthesia:    Anesthesia method:  Local infiltration   Local anesthetic:  Lidocaine 1% w/o epi and bupivacaine 0.25% w/o epi Procedure type:    Complexity:  Simple Procedure details:    Ultrasound guidance: no     Needle aspiration: no     Incision types:  Single straight   Incision depth:  Subcutaneous   Wound management:  Probed and deloculated and irrigated with saline   Drainage:  Purulent   Drainage amount:  Moderate   Wound treatment:  Wound left open   Packing materials:  1/4 in iodoform gauze   Amount 1/4" iodoform:   3 Post-procedure details:    Procedure completion:  Tolerated ____________________________________________   INITIAL IMPRESSION / ASSESSMENT AND PLAN / ED COURSE  As part of my medical decision making, I reviewed the following data within the electronic MEDICAL RECORD NUMBER Notes from prior ED visits  Patient with ED evaluation of a left labial abscess.  She did present to the ED and consented to the procedure.  The procedure successful with moderate purulent drainage noted.  A iodoform wick is placed, and patient is discharged with wound care instructions and supplies.  Return instructions have been provided.  Kristina Welch was evaluated in Emergency Department on 05/16/2021 for the symptoms described in the history of present illness. She was evaluated in the context of the global COVID-19 pandemic, which necessitated consideration that the patient might be at risk for infection with the SARS-CoV-2 virus that causes COVID-19. Institutional protocols and algorithms that pertain to the evaluation of patients at risk for COVID-19 are in a state of rapid change  based on information released by regulatory bodies including the CDC and federal and state organizations. These policies and algorithms were followed during the patient's care in the ED. ____________________________________________  FINAL CLINICAL IMPRESSION(S) / ED DIAGNOSES  Final diagnoses:  Labial abscess      Karmen Stabs, Charlesetta Ivory, PA-C 05/17/21 0005    Phineas Semen, MD 05/23/21 1037

## 2021-05-16 NOTE — Discharge Instructions (Addendum)
Take the antibiotic as prescribed.  Keep the wound clean, dry, covered.  Apply warm compresses to promote healing.  Follow-up with your provider for interim wound check.  Remove the packing in 3 days.

## 2021-07-03 ENCOUNTER — Emergency Department
Admission: EM | Admit: 2021-07-03 | Discharge: 2021-07-04 | Disposition: A | Discharge status: Home / Self Care | Payer: 59 | Attending: Emergency Medicine | Admitting: Emergency Medicine

## 2021-07-03 ENCOUNTER — Other Ambulatory Visit: Payer: Self-pay

## 2021-07-03 DIAGNOSIS — Z91010 Allergy to peanuts: Secondary | ICD-10-CM | POA: Insufficient documentation

## 2021-07-03 DIAGNOSIS — L02411 Cutaneous abscess of right axilla: Secondary | ICD-10-CM | POA: Diagnosis present

## 2021-07-03 NOTE — ED Triage Notes (Signed)
Pt in with co abscess to right axilla since yesterday. Pt has hx of the same states has HS. Pt states it has not drained and has not had fever.

## 2021-07-04 MED ORDER — LIDOCAINE HCL (PF) 1 % IJ SOLN
5.0000 mL | Freq: Once | INTRAMUSCULAR | Status: AC
  Administered 2021-07-04: 5 mL via INTRADERMAL
  Filled 2021-07-04: qty 5

## 2021-07-04 MED ORDER — OXYCODONE-ACETAMINOPHEN 5-325 MG PO TABS
1.0000 | ORAL_TABLET | Freq: Once | ORAL | Status: DC
  Filled 2021-07-04: qty 1

## 2021-07-04 MED ORDER — SULFAMETHOXAZOLE-TRIMETHOPRIM 800-160 MG PO TABS
1.0000 | ORAL_TABLET | Freq: Once | ORAL | Status: AC
  Administered 2021-07-04: 1 via ORAL
  Filled 2021-07-04: qty 1

## 2021-07-04 MED ORDER — SULFAMETHOXAZOLE-TRIMETHOPRIM 800-160 MG PO TABS: 1.0000 | ORAL_TABLET | Freq: Two times a day (BID) | ORAL | 0 refills | Status: AC

## 2021-07-04 NOTE — Discharge Instructions (Signed)
You have been seen in the Emergency Department (ED) today for an abscess. A skin abscess is a bacterial infection that forms a pocket of pus. A boil is a kind of skin abscess. This was drained in the ED. If you were prescribed antibiotics, please make sure to take it fully as prescribed.  Follow-up with your doctor in 24-48 hours for a re-check. If you do not have a doctor you may return to the ER for a re-check.  How can you care for yourself at home?  Apply warm and dry compresses, a heating pad set on low, or a hot water bottle 3 or 4 times a day for pain. Keep a cloth between the heat source and your skin.  If your doctor prescribed antibiotics, take them as directed. Do not stop taking them just because you feel better. You need to take the full course of antibiotics.  Take pain medicines exactly as directed.  If the doctor gave you a prescription medicine for pain, take it as prescribed.  If you are not taking a prescription pain medicine, ask your doctor if you can take an over-the-counter medicine. Keep your bandage clean and dry. Change the bandage whenever it gets wet or dirty, or at least one time a day.  If the abscess was packed with gauze:  Keep follow-up appointments to have the gauze changed or removed. If the doctor instructed you to remove the gauze, gently pull out all of the gauze when your doctor tells you to.  After the gauze is removed, soak the area in warm water for 15 to 20 minutes 2 times a day, until the wound closes.  When should you call for help?  Call your doctor now or seek immediate medical care if:  You have signs of worsening infection, such as:  Increased pain, swelling, warmth, or redness.  Red streaks leading from the infected skin.  Pus draining from the wound.  A fever. Uncontrolled nausea and vomiting Watch closely for changes in your health, and be sure to contact your doctor if:  You do not get better as expected.   When should you call for help?   Call your doctor now or seek immediate medical care if:  You have signs of worsening infection, such as:  Increased pain, swelling, warmth, or redness.  Red streaks leading from the infected skin.  Pus draining from the wound.  A fever. Uncontrolled nausea and vomiting Watch closely for changes in your health, and be sure to contact your doctor if:  You do not get better as expected  

## 2021-07-04 NOTE — ED Provider Notes (Signed)
[facility] Emergency Department Provider Note  ____________________________________________  Time seen: Approximately 12:53 AM  I have reviewed the triage vital signs and the nursing notes.   HISTORY  Chief Complaint Abscess   HPI Kristina Welch is a 30 y.o. female with a history of hydradenitis suppurativa who presents for evaluation of an abscess in her right axilla.  She is noticed the abscess yesterday.  She denies any fever or chills.  She reports a history of recurrent abscesses.  She does not take any antibiotics on a daily basis.  She is complaining of mild sharp constant pain.  Past Medical History:  Diagnosis Date   History of iron deficiency anemia     Patient Active Problem List   Diagnosis Date Noted   Urticaria 06/05/2016   DUB (dysfunctional uterine bleeding) 08/29/2015   H/O iron deficiency anemia 08/29/2015   Hidradenitis 08/29/2015    Past Surgical History:  Procedure Laterality Date   CESAREAN SECTION     cyst removed     TONSILLECTOMY      Prior to Admission medications   Medication Sig Start Date End Date Taking? Authorizing Provider  sulfamethoxazole-trimethoprim (BACTRIM DS) 800-160 MG tablet Take 1 tablet by mouth 2 (two) times daily for 10 days. 07/04/21 07/14/21 Yes Jammie Clink, [place], MD  benzonatate (TESSALON) 100 MG capsule Take 2 capsules (200 mg total) by mouth every 8 (eight) hours. 09/05/20   Becky Augusta, NP  ergocalciferol (VITAMIN D2) 1.25 MG (50000 UT) capsule 1 po weekly x 2 months, then monthly thereafter 04/17/20   [provider]  ferrous sulfate 325 (65 FE) MG tablet Take 325 mg by mouth daily with breakfast.    [provider]  ipratropium (ATROVENT) 0.06 % nasal spray Place 2 sprays into both nostrils 4 (four) times daily. 09/05/20   Becky Augusta, NP  promethazine-dextromethorphan (PROMETHAZINE-DM) 6.25-15 MG/5ML syrup Take 5 mLs by mouth 4 (four) times daily as needed. Patient  taking differently: Take 5 mLs by mouth 4 (four) times daily as needed. 09/05/20   Becky Augusta, NP  fluticasone [facility]) 50 MCG/ACT nasal spray Place 2 sprays into both nostrils daily. 10/25/19 04/24/20  Domenick Gong, MD    Allergies Peanut-containing drug products and Zyrtec [cetirizine]  Family History  Problem Relation Age of Onset   Cancer Mother        remission   Hypertension Mother    Hypertension Father    Healthy Daughter    Healthy Son    Cancer Maternal Grandmother        breast    Social History Social History   Tobacco Use   Smoking status: Never   Smokeless tobacco: Never  Vaping Use   Vaping Use: Never used  Substance Use Topics   Alcohol use: No   Drug use: No    Review of Systems  Constitutional: Negative for fever. Eyes: Negative for visual changes. ENT: Negative for sore throat. Neck: No neck pain  Cardiovascular: Negative for chest pain. Respiratory: Negative for shortness of breath. Gastrointestinal: Negative for abdominal pain, vomiting or diarrhea. Genitourinary: Negative for dysuria. Musculoskeletal: Negative for back pain. Skin: Negative for rash. + abscess Neurological: Negative for headaches, weakness or numbness. Psych: No SI or HI  ____________________________________________   PHYSICAL EXAM:  VITAL SIGNS: ED Triage Vitals  Enc Vitals Group     BP 07/03/21 2118 132/82     Pulse Rate 07/03/21 2118 72     Resp 07/03/21 2118 20  Temp 07/03/21 2118 98.8 F (37.1 C)     Temp Source 07/03/21 2118 Oral     SpO2 07/03/21 2118 100 %     Weight 07/03/21 2119 200 lb 9.9 oz (91 kg)     Height 07/03/21 2119 5\' 3"  (1.6 m)     Head Circumference --      Peak Flow --      Pain Score 07/03/21 2119 5     Pain Loc --      Pain Edu? --      Excl. in GC? --     Constitutional: Alert and oriented. Well appearing and in no apparent distress. HEENT:      Head: Normocephalic and atraumatic.         Eyes: Conjunctivae are normal.  Sclera is non-icteric.       Mouth/Throat: Mucous membranes are moist.       Neck: Supple with no signs of meningismus. Cardiovascular: Regular rate and rhythm.  Respiratory: Normal respiratory effort.  Musculoskeletal:  5 cm abscess located in the right axilla with overlying erythema and warm Neurologic: Normal speech and language. Face is symmetric. Moving all extremities. No gross focal neurologic deficits are appreciated. Skin: Skin is warm, dry and intact. No rash noted. Psychiatric: Mood and affect are normal. Speech and behavior are normal.  ____________________________________________   LABS (all labs ordered are listed, but only abnormal results are displayed)  Labs Reviewed - No data to display ____________________________________________  EKG  none  ____________________________________________  RADIOLOGY  none  ____________________________________________   PROCEDURES  Procedure(s) performed: yes .2120Incision and Drainage  Date/Time: 07/04/2021 12:54 AM Performed by: 07/06/2021, MD Authorized by: Nita Sickle, MD   Consent:    Consent obtained:  Verbal   Consent given by:  Patient   Risks discussed:  Bleeding, infection, incomplete drainage and pain   Alternatives discussed:  Alternative treatment, delayed treatment and observation Universal protocol:    Patient identity confirmed:  Verbally with patient Location:    Type:  Abscess   Size:  5   Location: right axilla. Pre-procedure details:    Skin preparation:  Povidone-iodine Sedation:    Sedation type:  None Anesthesia:    Anesthesia method:  Local infiltration   Local anesthetic:  Lidocaine 1% w/o epi Procedure type:    Complexity:  Complex Procedure details:    Incision types:  Stab incision   Wound management:  Probed and deloculated and irrigated with saline   Drainage:  Purulent   Drainage amount:  Moderate   Wound treatment:  Wound left open   Packing materials:   None Post-procedure details:    Procedure completion:  Tolerated well, no immediate complications   Critical Care performed:  None ____________________________________________   INITIAL IMPRESSION / ASSESSMENT AND PLAN / ED COURSE  30 y.o. female with a history of hydradenitis suppurativa who presents for evaluation of an abscess in her right axilla.  Abscess drained per procedure note above.  Patient started on Bactrim for overlying cellulitis.  Discussed wound care, follow-up with PCP, my standard return precautions.  Old medical records reviewed including prior visits for abscesses.  No systemic symptoms and normal vital signs with no signs of systemic infection.      _____________________________________________ Please note:  Patient was evaluated in Emergency Department today for the symptoms described in the history of present illness. Patient was evaluated in the context of the global COVID-19 pandemic, which necessitated consideration that the patient might be at risk for  infection with the SARS-CoV-2 virus that causes COVID-19. Institutional protocols and algorithms that pertain to the evaluation of patients at risk for COVID-19 are in a state of rapid change based on information released by regulatory bodies including the CDC and federal and state organizations. These policies and algorithms were followed during the patient's care in the ED.  Some ED evaluations and interventions may be delayed as a result of limited staffing during the pandemic.   [place] Controlled Substance Database was reviewed by me. ____________________________________________   FINAL CLINICAL IMPRESSION(S) / ED DIAGNOSES   Final diagnoses:  Abscess of axilla, right      NEW MEDICATIONS STARTED DURING THIS VISIT:  ED Discharge Orders          Ordered    sulfamethoxazole-trimethoprim (BACTRIM DS) 800-160 MG tablet  2 times daily        07/04/21 0052             Note:  This document was  prepared using Dragon voice recognition software and may include unintentional dictation errors.    Nita Sickle, MD 07/04/21 [phone]

## 2021-09-21 ENCOUNTER — Other Ambulatory Visit: Payer: Self-pay

## 2021-09-21 ENCOUNTER — Ambulatory Visit
Admission: EM | Admit: 2021-09-21 | Discharge: 2021-09-21 | Disposition: A | Discharge status: Home / Self Care | Payer: 59 | Attending: Physician Assistant | Admitting: Physician Assistant

## 2021-09-21 DIAGNOSIS — H1031 Unspecified acute conjunctivitis, right eye: Secondary | ICD-10-CM

## 2021-09-21 MED ORDER — MOXIFLOXACIN HCL 0.5 % OP SOLN: 1.0000 [drp] | Freq: Three times a day (TID) | OPHTHALMIC | 0 refills | Status: AC

## 2021-09-21 NOTE — ED Triage Notes (Signed)
Pt c/o eye redness and swelling in the right eye x1day ?

## 2021-09-21 NOTE — Discharge Instructions (Addendum)
-  Sent antibiotic eyedrops for pinkeye. ?- Start Claritin for allergies. ?

## 2021-09-21 NOTE — ED Provider Notes (Signed)
?[facility] ? ? ? ?CSN: [id number] ?Arrival date & time: 09/21/21  0846 ? ? ?  ? ?History   ?Chief Complaint ?Chief Complaint  ?Patient presents with  ? Eye Problem  ? ? ?HPI ?Kristina Welch is a 31 y.o. female presenting for right eye redness, itching and drainage since last night.  Daughter has similar symptoms.  They were reportedly playing on the same videogame system.  Patient has not treated condition in any way.  She does report that she has had sneezing, cough and congestion for the past 1 week.  Reports COVID exposure at work but has tested negative for COVID.  No fevers.  URI symptoms improving.  No other concerns. ? ?HPI ? ?Past Medical History:  ?Diagnosis Date  ? History of iron deficiency anemia   ? ? ?Patient Active Problem List  ? Diagnosis Date Noted  ? Urticaria 06/05/2016  ? DUB (dysfunctional uterine bleeding) 08/29/2015  ? H/O iron deficiency anemia 08/29/2015  ? Hidradenitis 08/29/2015  ? ? ?Past Surgical History:  ?Procedure Laterality Date  ? CESAREAN SECTION    ? cyst removed    ? TONSILLECTOMY    ? ? ?OB History   ? ? Gravida  ?3  ? Para  ?2  ? Term  ?   ? Preterm  ?   ? AB  ?   ? Living  ?   ?  ? ? SAB  ?   ? IAB  ?   ? Ectopic  ?   ? Multiple  ?   ? Live Births  ?   ?   ?  ?  ? ? ? ?Home Medications   ? ?Prior to Admission medications   ?Medication Sig Start Date End Date Taking? Authorizing Provider  ?moxifloxacin (VIGAMOX) 0.5 % ophthalmic solution Place 1 drop into the right eye 3 (three) times daily for 7 days. 09/21/21 09/28/21 Yes Laurene Footman B, PA-C  ?ferrous sulfate 325 (65 FE) MG tablet Take 325 mg by mouth daily with breakfast.    [provider]  ?fluticasone (FLONASE) 50 MCG/ACT nasal spray Place 2 sprays into both nostrils daily. 10/25/19 04/24/20  Melynda Ripple, MD  ? ? ?Family History ?Family History  ?Problem Relation Age of Onset  ? Cancer Mother   ?     remission  ? Hypertension Mother   ? Hypertension Father   ? Healthy Daughter   ? Healthy  Son   ? Cancer Maternal Grandmother   ?     breast  ? ? ?Social History ?Social History  ? ?Tobacco Use  ? Smoking status: Never  ? Smokeless tobacco: Never  ?Vaping Use  ? Vaping Use: Never used  ?Substance Use Topics  ? Alcohol use: No  ? Drug use: No  ? ? ? ?Allergies   ?Peanut-containing drug products, Amoxicillin-pot clavulanate, and Cetirizine ? ? ?Review of Systems ?Review of Systems  ?Constitutional:  Negative for fatigue and fever.  ?HENT:  Positive for congestion, rhinorrhea and sneezing. Negative for facial swelling and sore throat.   ?Eyes:  Positive for discharge, redness and itching. Negative for photophobia, pain and visual disturbance.  ?Respiratory:  Positive for cough.   ?Neurological:  Negative for dizziness and headaches.  ? ? ?Physical Exam ?Triage Vital Signs ?ED Triage Vitals  ?Enc Vitals Group  ?   BP 09/21/21 0916 112/74  ?   Pulse Rate 09/21/21 0916 69  ?   Resp 09/21/21 0916 18  ?  Temp 09/21/21 0916 98.5 ?F (36.9 ?C)  ?   Temp Source 09/21/21 0916 Oral  ?   SpO2 09/21/21 0916 100 %  ?   Weight 09/21/21 0914 198 lb 6.6 oz (90 kg)  ?   Height 09/21/21 0914 5\' 3"  (1.6 m)  ?   Head Circumference --   ?   Peak Flow --   ?   Pain Score 09/21/21 0913 3  ?   Pain Loc --   ?   Pain Edu? --   ?   Excl. in [place]? --   ? ?No data found. ? ?Updated Vital Signs ?BP 112/74 (BP Location: Left Arm)   Pulse 69   Temp 98.5 ?F (36.9 ?C) (Oral)   Resp 18   Ht 5\' 3"  (1.6 m)   Wt 198 lb 6.6 oz (90 kg)   LMP 08/30/2021   SpO2 100%   BMI 35.15 kg/m?  ?   ? ?Physical Exam ?Vitals and nursing note reviewed.  ?Constitutional:   ?   General: She is not in acute distress. ?   Appearance: Normal appearance. She is not ill-appearing or toxic-appearing.  ?HENT:  ?   Head: Normocephalic and atraumatic.  ?   Nose: Congestion present.  ?   Mouth/Throat:  ?   Mouth: Mucous membranes are moist.  ?   Pharynx: Oropharynx is clear.  ?Eyes:  ?   General: No scleral icterus.    ?   Right eye: Discharge present.     ?   Left  eye: No discharge.  ?   Conjunctiva/sclera:  ?   Right eye: Right conjunctiva is injected.  ?   Comments: Thick yellowish-green drainage from right eye  ?Cardiovascular:  ?   Rate and Rhythm: Normal rate and regular rhythm.  ?   Heart sounds: Normal heart sounds.  ?Pulmonary:  ?   Effort: Pulmonary effort is normal. No respiratory distress.  ?   Breath sounds: Normal breath sounds.  ?Musculoskeletal:  ?   Cervical back: Neck supple.  ?Skin: ?   General: Skin is dry.  ?Neurological:  ?   General: No focal deficit present.  ?   Mental Status: She is alert. Mental status is at baseline.  ?   Motor: No weakness.  ?   Gait: Gait normal.  ?Psychiatric:     ?   Mood and Affect: Mood normal.     ?   Behavior: Behavior normal.     ?   Thought Content: Thought content normal.  ? ? ? ?UC Treatments / Results  ?Labs ?(all labs ordered are listed, but only abnormal results are displayed) ?Labs Reviewed - No data to display ? ?EKG ? ? ?Radiology ?No results found. ? ?Procedures ?Procedures (including critical care time) ? ?Medications Ordered in UC ?Medications - No data to display ? ?Initial Impression / Assessment and Plan / UC Course  ?I have reviewed the triage vital signs and the nursing notes. ? ?Pertinent labs & imaging results that were available during my care of the patient were reviewed by me and considered in my medical decision making (see chart for details). ? ?31 year old female presenting for right eye redness, itching and drainage since last night. Clinical exam today is consistent with conjunctivitis, suspect bacterial.  Treating with Vigamox.  Reviewed that this condition is very contagious.  Advised good hygiene.  Reviewed return precautions. ? ?Patient also presenting for cough, congestion and sneezing for the past 1 week.  On exam  she does have allergic shiners and nasal congestion.  Advised her symptoms likely due to allergies.  Suggested Claritin and Flonase.  Follow-up as needed. ? ?Final Clinical  Impressions(s) / UC Diagnoses  ? ?Final diagnoses:  ?Acute bacterial conjunctivitis of right eye  ? ? ? ?Discharge Instructions   ? ?  ?-Sent antibiotic eyedrops for pinkeye. ?- Start Claritin for allergies. ? ? ? ? ?ED Prescriptions   ? ? Medication Sig Dispense Auth. Provider  ? moxifloxacin (VIGAMOX) 0.5 % ophthalmic solution Place 1 drop into the right eye 3 (three) times daily for 7 days. 3 mL Danton Clap, PA-C  ? ?  ? ?PDMP not reviewed this encounter. ?  ?Danton Clap, PA-C ?09/21/21 [id number] ? ?

## 2021-09-22 ENCOUNTER — Ambulatory Visit
Admission: EM | Admit: 2021-09-22 | Discharge: 2021-09-22 | Disposition: A | Discharge status: Home / Self Care | Payer: 59 | Attending: Internal Medicine | Admitting: Internal Medicine

## 2021-09-22 ENCOUNTER — Emergency Department: Admit: 2021-09-22 | Discharge status: Against Medical Advice | Payer: Self-pay

## 2021-09-22 ENCOUNTER — Other Ambulatory Visit: Payer: Self-pay

## 2021-09-22 ENCOUNTER — Ambulatory Visit (INDEPENDENT_AMBULATORY_CARE_PROVIDER_SITE_OTHER): Payer: 59

## 2021-09-22 DIAGNOSIS — R0982 Postnasal drip: Secondary | ICD-10-CM

## 2021-09-22 DIAGNOSIS — M791 Myalgia, unspecified site: Secondary | ICD-10-CM

## 2021-09-22 DIAGNOSIS — R509 Fever, unspecified: Secondary | ICD-10-CM | POA: Diagnosis not present

## 2021-09-22 DIAGNOSIS — R059 Cough, unspecified: Secondary | ICD-10-CM

## 2021-09-22 DIAGNOSIS — R062 Wheezing: Secondary | ICD-10-CM

## 2021-09-22 DIAGNOSIS — Z20822 Contact with and (suspected) exposure to covid-19: Secondary | ICD-10-CM

## 2021-09-22 DIAGNOSIS — R6889 Other general symptoms and signs: Secondary | ICD-10-CM | POA: Diagnosis not present

## 2021-09-22 MED ORDER — AZITHROMYCIN 250 MG PO TABS: ORAL_TABLET | ORAL | 0 refills | Status: DC

## 2021-09-22 MED ORDER — ALBUTEROL SULFATE HFA 108 (90 BASE) MCG/ACT IN AERS: 2.0000 | INHALATION_SPRAY | RESPIRATORY_TRACT | 0 refills | Status: DC | PRN

## 2021-09-22 MED ORDER — PREDNISONE 50 MG PO TABS: ORAL_TABLET | ORAL | 0 refills | Status: DC

## 2021-09-22 NOTE — ED Provider Notes (Signed)
?[facility] ? ? ? ?CSN: [id number] ?Arrival date & time: 09/22/21  1550 ? ? ?  ? ?History   ?Chief Complaint ?No chief complaint on file. ? ? ?HPI ?Kristina Welch is a 31 y.o. female who presents a few minutes before closing time and who developed a fever last night with body aches and today has continued with a fever of 101 and aches.  Her cough is not worse, but has a wheezy cough. Denies worse rhinitis than her allergies give her. Does not have a HA. Has a co-worker who had covid.  ?She has had rhinitis and cough x 1 week. Her cough is non productive but has been told by her co-workers she has a smokers cough.  ? ? ?Past Medical History:  ?Diagnosis Date  ? History of iron deficiency anemia   ? ? ?Patient Active Problem List  ? Diagnosis Date Noted  ? Urticaria 06/05/2016  ? DUB (dysfunctional uterine bleeding) 08/29/2015  ? H/O iron deficiency anemia 08/29/2015  ? Hidradenitis 08/29/2015  ? ? ?Past Surgical History:  ?Procedure Laterality Date  ? CESAREAN SECTION    ? cyst removed    ? TONSILLECTOMY    ? ? ?OB History   ? ? Gravida  ?3  ? Para  ?2  ? Term  ?   ? Preterm  ?   ? AB  ?   ? Living  ?   ?  ? ? SAB  ?   ? IAB  ?   ? Ectopic  ?   ? Multiple  ?   ? Live Births  ?   ?   ?  ?  ? ? ? ?Home Medications   ? ?Prior to Admission medications   ?Medication Sig Start Date End Date Taking? Authorizing Provider  ?albuterol (VENTOLIN HFA) 108 (90 Base) MCG/ACT inhaler Inhale 2 puffs into the lungs every 4 (four) hours as needed. Q 4h for 5 days then prn cough and wheezing there after 09/22/21  Yes Rodriguez-Southworth, Nettie Elm, PA-C  ?predniSONE (DELTASONE) 50 MG tablet One qd 09/22/21  Yes Rodriguez-Southworth, Nettie Elm, PA-C  ?ferrous sulfate 325 (65 FE) MG tablet Take 325 mg by mouth daily with breakfast.    [provider]  ?moxifloxacin (VIGAMOX) 0.5 % ophthalmic solution Place 1 drop into the right eye 3 (three) times daily for 7 days. 09/21/21 09/28/21  Shirlee Latch, PA-C  ?fluticasone  (FLONASE) 50 MCG/ACT nasal spray Place 2 sprays into both nostrils daily. 10/25/19 04/24/20  Domenick Gong, MD  ? ? ?Family History ?Family History  ?Problem Relation Age of Onset  ? Cancer Mother   ?     remission  ? Hypertension Mother   ? Hypertension Father   ? Healthy Daughter   ? Healthy Son   ? Cancer Maternal Grandmother   ?     breast  ? ? ?Social History ?Social History  ? ?Tobacco Use  ? Smoking status: Never  ? Smokeless tobacco: Never  ?Vaping Use  ? Vaping Use: Never used  ?Substance Use Topics  ? Alcohol use: No  ? Drug use: No  ? ? ? ?Allergies   ?Peanut-containing drug products, Amoxicillin-pot clavulanate, and Cetirizine ? ? ?Review of Systems ?Review of Systems  ?Constitutional:  Positive for chills and fever.  ?HENT:  Positive for postnasal drip. Negative for ear discharge, ear pain and sore throat.   ?Eyes:  Positive for discharge and redness. Negative for pain.  ?Respiratory:  Positive for  cough. Negative for chest tightness and shortness of breath.   ?Cardiovascular:  Negative for chest pain.  ?Musculoskeletal:  Positive for myalgias.  ?Skin:  Negative for rash.  ?Neurological:  Negative for headaches.  ? ? ?Physical Exam ?Triage Vital Signs ?ED Triage Vitals  ?Enc Vitals Group  ?   BP 09/22/21 1559 125/85  ?   Pulse Rate 09/22/21 1559 (!) 107  ?   Resp 09/22/21 1559 18  ?   Temp 09/22/21 1559 (!) 100.8 ?F (38.2 ?C)  ?   Temp Source 09/22/21 1559 Oral  ?   SpO2 09/22/21 1559 98 %  ?   Weight 09/22/21 1558 198 lb 6.6 oz (90 kg)  ?   Height 09/22/21 1558 5\' 3"  (1.6 m)  ?   Head Circumference --   ?   Peak Flow --   ?   Pain Score 09/22/21 1558 0  ?   Pain Loc --   ?   Pain Edu? --   ?   Excl. in [place]? --   ? ?No data found. ? ?Updated Vital Signs ?BP 125/85 (BP Location: Left Arm)   Pulse (!) 107   Temp (!) 100.8 ?F (38.2 ?C) (Oral)   Resp 18   Ht 5\' 3"  (1.6 m)   Wt 198 lb 6.6 oz (90 kg)   LMP 08/30/2021   SpO2 98%   BMI 35.15 kg/m?  ? ?Visual Acuity ?Right Eye Distance:   ?Left Eye  Distance:   ?Bilateral Distance:   ? ?Right Eye Near:   ?Left Eye Near:    ?Bilateral Near:    ? ? ?Physical Exam ?Constitutional:   ?   General:she is not in acute distress. ?   Appearance: She is not toxic-appearing.  ?HENT:  ?   Head: Normocephalic.  ?   Right Ear: Tympanic membrane, ear canal and external ear normal.  ?   Left Ear: Ear canal and external ear normal.  ?   Nose: Nose normal.  ?   Mouth/Throat:  ?   Mouth: Mucous membranes are moist.  ?   Pharynx: Oropharynx is clear.  ?Eyes: R eye is tearing ?   General: No scleral icterus. ?   Conjunctiva/sclera: Conjunctivae normal on L , mild injection on the R ?Cardiovascular:  ?   Rate and Rhythm: Normal rate and regular rhythm.  ?   Heart sounds: No murmur heard.  ? ?Pulmonary:  ?   Effort: Pulmonary effort is normal. No respiratory distress.  ?   Breath sounds: Wheezing present.  ?   Comments: Has a wheezy sounding cough ?Musculoskeletal:     ?   General: Normal range of motion.  ?   Cervical back: Neck supple.  ?Lymphadenopathy:  ?   Cervical: No cervical adenopathy.  ?Skin: ?   General: Skin is warm and dry.  ?   Findings: No rash.  ?Neurological:  ?   Mental Status: she is alert and oriented to person, place, and time.  ?   Gait: Gait normal.  ?Psychiatric:     ?   Mood and Affect: Mood normal.     ?   Behavior: Behavior normal.     ?   Thought Content: Thought content normal.     ?   Judgment: Judgment normal.  ? ? ?UC Treatments / Results  ?Labs ?(all labs ordered are listed, but only abnormal results are displayed) ?Labs Reviewed  ?SARS CORONAVIRUS 2 (TAT 6-24 HRS)  ? ? ?EKG ? ? ?  Radiology ?DG Chest 2 View ? ?Result Date: 09/22/2021 ?CLINICAL DATA:  Cough and fever EXAM: CHEST - 2 VIEW COMPARISON:  Chest radiograph dated May 06, 2015 FINDINGS: The heart size and mediastinal contours are within normal limits. Both lungs are clear. The visualized skeletal structures are unremarkable. IMPRESSION: No active cardiopulmonary disease. Electronically Signed    By: Larose Hires D.O.   On: 09/22/2021 16:29   ? ?Procedures ?Procedures (including critical care time) ? ?Medications Ordered in UC ?Medications - No data to display ? ?Initial Impression / Assessment and Plan / UC Course  ?I have reviewed the triage vital signs and the nursing notes. ?Pertinent  imaging results that were available during my care of the patient were reviewed by me and considered in my medical decision making (see chart for details). ?Covid suspect, Covid test is pending and we will call her if positive ?I placed her on Albuterol inhaler and Prednisone. ?I sent zpack in error and I called the pharmacy and pt to let them know this needs to be cancelled.  ?See instructions.  ? ? ? ? ?Final Clinical Impressions(s) / UC Diagnoses  ? ?Final diagnoses:  ?Suspected COVID-19 virus infection  ?Flu-like symptoms  ? ? ? ?Discharge Instructions   ? ?  ?We will inform you of your covid test if positive, but you will be able to see the results via Mychart.  ?If your covid test is negative, and you develop a HA with a lot of runny nose tomorrow, then you need a rapid flu test. I did not ran one today since you dont have those symptoms and been sick for one week. But the fever is new, so for the flu in order to benefit from the antiviral medication, it needs to be less than 72 hours. ? ?Your chest xray is normal.  ? ? ? ? ? ? ?PDMP not reviewed this encounter. ?  Garey Ham, PA-C ?09/22/21 1648 ? ?

## 2021-09-22 NOTE — Discharge Instructions (Addendum)
We will inform you of your covid test if positive, but you will be able to see the results via Mychart.  ?If your covid test is negative, and you develop a HA with a lot of runny nose tomorrow, then you need a rapid flu test. I did not ran one today since you dont have those symptoms and been sick for one week. But the fever is new, so for the flu in order to benefit from the antiviral medication, it needs to be less than 72 hours. ? ?Your chest xray is normal.  ?

## 2021-09-22 NOTE — ED Triage Notes (Signed)
Pt states that she has gotten worse since yesterday. Pt states that she felt worse come Saturday night around 10.  ? ?Pt states that her temperature has been fluctuating between 99-101. Pt has a knot under her right ear.  ? ?Pt is worried about covid or flu.  ?

## 2021-09-23 LAB — SARS CORONAVIRUS 2 (TAT 6-24 HRS): SARS Coronavirus 2: NEGATIVE

## 2021-12-31 ENCOUNTER — Emergency Department: Payer: 59

## 2021-12-31 ENCOUNTER — Emergency Department
Admission: EM | Admit: 2021-12-31 | Discharge: 2022-01-01 | Disposition: A | Discharge status: Home / Self Care | Payer: 59 | Attending: Emergency Medicine | Admitting: Emergency Medicine

## 2021-12-31 ENCOUNTER — Encounter: Payer: Self-pay | Admitting: Emergency Medicine

## 2021-12-31 ENCOUNTER — Ambulatory Visit
Admission: RE | Admit: 2021-12-31 | Discharge: 2021-12-31 | Disposition: A | Discharge status: Home / Self Care | Payer: 59 | Source: Ambulatory Visit | Attending: Emergency Medicine | Admitting: Emergency Medicine

## 2021-12-31 VITALS — BP 140/93 | HR 80 | Temp 98.4°F | Resp 16

## 2021-12-31 DIAGNOSIS — R1031 Right lower quadrant pain: Secondary | ICD-10-CM

## 2021-12-31 DIAGNOSIS — R350 Frequency of micturition: Secondary | ICD-10-CM | POA: Insufficient documentation

## 2021-12-31 DIAGNOSIS — R102 Pelvic and perineal pain: Secondary | ICD-10-CM | POA: Insufficient documentation

## 2021-12-31 DIAGNOSIS — G8929 Other chronic pain: Secondary | ICD-10-CM

## 2021-12-31 LAB — CBC
HCT: 29.1 % — ABNORMAL LOW (ref 36.0–46.0)
Hemoglobin: 9.2 g/dL — ABNORMAL LOW (ref 12.0–15.0)
MCH: 28.1 pg (ref 26.0–34.0)
MCHC: 31.6 g/dL (ref 30.0–36.0)
MCV: 89 fL (ref 80.0–100.0)
Platelets: 299 10*3/uL (ref 150–400)
RBC: 3.27 MIL/uL — ABNORMAL LOW (ref 3.87–5.11)
RDW: 17.3 % — ABNORMAL HIGH (ref 11.5–15.5)
WBC: 5.4 10*3/uL (ref 4.0–10.5)
nRBC: 0 % (ref 0.0–0.2)

## 2021-12-31 LAB — URINALYSIS, ROUTINE W REFLEX MICROSCOPIC
Bilirubin Urine: NEGATIVE
Bilirubin Urine: NEGATIVE
Glucose, UA: NEGATIVE mg/dL
Glucose, UA: NEGATIVE mg/dL
Hgb urine dipstick: NEGATIVE
Hgb urine dipstick: NEGATIVE
Ketones, ur: 15 mg/dL — AB
Ketones, ur: 5 mg/dL — AB
Leukocytes,Ua: NEGATIVE
Leukocytes,Ua: NEGATIVE
Nitrite: NEGATIVE
Nitrite: NEGATIVE
Protein, ur: NEGATIVE mg/dL
Protein, ur: NEGATIVE mg/dL
Specific Gravity, Urine: 1.02 (ref 1.005–1.030)
Specific Gravity, Urine: 1.021 (ref 1.005–1.030)
pH: 5.5 (ref 5.0–8.0)
pH: 6 (ref 5.0–8.0)

## 2021-12-31 LAB — COMPREHENSIVE METABOLIC PANEL
ALT: 14 U/L (ref 0–44)
AST: 16 U/L (ref 15–41)
Albumin: 3.6 g/dL (ref 3.5–5.0)
Alkaline Phosphatase: 49 U/L (ref 38–126)
Anion gap: 6 (ref 5–15)
BUN: 13 mg/dL (ref 6–20)
CO2: 26 mmol/L (ref 22–32)
Calcium: 9 mg/dL (ref 8.9–10.3)
Chloride: 106 mmol/L (ref 98–111)
Creatinine, Ser: 0.93 mg/dL (ref 0.44–1.00)
GFR, Estimated: >60 mL/min (ref >60)
Glucose, Bld: 90 mg/dL (ref 70–99)
Potassium: 3.8 mmol/L (ref 3.5–5.1)
Sodium: 138 mmol/L (ref 135–145)
Total Bilirubin: 0.6 mg/dL (ref 0.3–1.2)
Total Protein: 7.1 g/dL (ref 6.5–8.1)

## 2021-12-31 LAB — PREGNANCY, URINE
Preg Test, Ur: NEGATIVE
Preg Test, Ur: NEGATIVE

## 2021-12-31 LAB — LIPASE, BLOOD: Lipase: 34 U/L (ref 11–51)

## 2021-12-31 NOTE — Discharge Instructions (Signed)
Your urine test are negative You need a pelvic ultrasound which we cant do here, but can be done in the ER.

## 2021-12-31 NOTE — ED Provider Notes (Signed)
[facility]    CSN: [id number] Arrival date & time: 12/31/21  1800      History   Chief Complaint Chief Complaint  Patient presents with   Groin Pain    Pain the pelvic area when coughing, sitting down/standing up. Legs and ankles are a little swollen, fatigue - Entered by patient    HPI Kristina Welch is a 31 y.o. female who presents with onset of R lower abdominal pain x  several days. The pain is provoked with sitting, coughing, and standing from sitting. Denies past hx of ovarian cyst.  Denies currently having abnormal vaginal discharge. Last month had brown discharge and she already had her period.  She noticed 6 lb weight gain in 2 days and her R arm and R ankle/feet  feel tight to her. She has not had a sexual partner in 4 years.     Past Medical History:  Diagnosis Date   History of iron deficiency anemia     Patient Active Problem List   Diagnosis Date Noted   Urticaria 06/05/2016   DUB (dysfunctional uterine bleeding) 08/29/2015   H/O iron deficiency anemia 08/29/2015   Hidradenitis 08/29/2015    Past Surgical History:  Procedure Laterality Date   CESAREAN SECTION     cyst removed     TONSILLECTOMY      OB History     Gravida  3   Para  2   Term      Preterm      AB      Living         SAB      IAB      Ectopic      Multiple      Live Births               Home Medications    Prior to Admission medications   Medication Sig Start Date End Date Taking? Authorizing Provider  albuterol (VENTOLIN HFA) 108 (90 Base) MCG/ACT inhaler Inhale 2 puffs into the lungs every 4 (four) hours as needed. Q 4h for 5 days then prn cough and wheezing there after 09/22/21   Rodriguez-Southworth, Nettie Elm, PA-C  ferrous sulfate 325 (65 FE) MG tablet Take 325 mg by mouth daily with breakfast.    [provider]  predniSONE (DELTASONE) 50 MG tablet One qd 09/22/21   Rodriguez-Southworth, Nettie Elm, PA-C  fluticasone (FLONASE) 50  MCG/ACT nasal spray Place 2 sprays into both nostrils daily. 10/25/19 04/24/20  Domenick Gong, MD    Family History Family History  Problem Relation Age of Onset   Cancer Mother        remission   Hypertension Mother    Hypertension Father    Healthy Daughter    Healthy Son    Cancer Maternal Grandmother        breast    Social History Social History   Tobacco Use   Smoking status: Never   Smokeless tobacco: Never  Vaping Use   Vaping Use: Never used  Substance Use Topics   Alcohol use: No   Drug use: No     Allergies   Peanut-containing drug products, Amoxicillin-pot clavulanate, and Cetirizine   Review of Systems Review of Systems  Constitutional:  Negative for fever.  Cardiovascular:  Positive for leg swelling.  Gastrointestinal:  Positive for abdominal pain and constipation. Negative for nausea and vomiting.  Genitourinary:  Positive for pelvic pain. Negative for dysuria, frequency and vaginal discharge.  Physical Exam Triage Vital Signs ED Triage Vitals  Enc Vitals Group     BP 12/31/21 1817 (!) 140/93     Pulse Rate 12/31/21 1817 80     Resp 12/31/21 1817 16     Temp 12/31/21 1817 98.4 F (36.9 C)     Temp Source 12/31/21 1817 Oral     SpO2 12/31/21 1817 97 %     Weight --      Height --      Head Circumference --      Peak Flow --      Pain Score 12/31/21 1818 4     Pain Loc --      Pain Edu? --      Excl. in [place]? --    No data found.  Updated Vital Signs BP (!) 140/93 (BP Location: Left Arm)   Pulse 80   Temp 98.4 F (36.9 C) (Oral)   Resp 16   SpO2 97%   Visual Acuity Right Eye Distance:   Left Eye Distance:   Bilateral Distance:    Right Eye Near:   Left Eye Near:    Bilateral Near:     Physical Exam Vitals and nursing note reviewed.  Constitutional:      General: She is not in acute distress.    Appearance: She is obese. She is not toxic-appearing.  HENT:     Right Ear: External ear normal.     Left Ear: External  ear normal.  Eyes:     General: No scleral icterus.    Conjunctiva/sclera: Conjunctivae normal.  Pulmonary:     Effort: Pulmonary effort is normal.  Abdominal:     Palpations: Abdomen is soft.     Tenderness: There is abdominal tenderness. There is guarding. There is no rebound.  Genitourinary:    Comments: Bimanual exam of cervical motion provoked pain on her RLQ area Musculoskeletal:        General: Normal range of motion.     Cervical back: Neck supple.     Right lower leg: No edema.     Left lower leg: No edema.  Skin:    General: Skin is warm and dry.     Findings: No rash.  Neurological:     Mental Status: She is alert and oriented to person, place, and time.     Gait: Gait normal.  Psychiatric:        Mood and Affect: Mood normal.        Behavior: Behavior normal.        Thought Content: Thought content normal.        Judgment: Judgment normal.      UC Treatments / Results  Labs (all labs ordered are listed, but only abnormal results are displayed) Labs Reviewed  URINE CULTURE  PREGNANCY, URINE  URINALYSIS, ROUTINE W REFLEX MICROSCOPIC    EKG   Radiology No results found.  Procedures Procedures (including critical care time)  Medications Ordered in UC Medications - No data to display  Initial Impression / Assessment and Plan / UC Course  I have reviewed the triage vital signs and the nursing notes. Pertinent labs  results that were available during my care of the patient were reviewed by me and considered in my medical decision making (see chart for details).  RLQ pain which seems ovarian origin, may have a cyst  Sent to ER for further work up.    Final Clinical Impressions(s) / UC Diagnoses   Final diagnoses:  None   Discharge Instructions   None    ED Prescriptions   None    PDMP not reviewed this encounter.   Garey HamRodriguez-Southworth, Cherree Conerly, Cordelia Poche-C 12/31/21 1935

## 2021-12-31 NOTE — Discharge Instructions (Signed)
Your exam, labs, ultrasound, and CT were all normal and reassuring at this time.  No findings to explain your symptoms.  You should follow-up with primary provider or Dr. Feliberto Gottron for ongoing evaluation.  Return to the ED if needed.

## 2021-12-31 NOTE — ED Triage Notes (Signed)
Pt sent to ED by UC for pelvic US due to right sided pelvic pain x1 day with no vaginal bleeding/discharge. External palpitation of uterus at [facility] noted painful on right side.  NEGATIVE PREGNANCY TEST SEEN ON RESULTS TODAY FORM UC IN CHART

## 2021-12-31 NOTE — ED Triage Notes (Signed)
Pt reports right-side groin pain for several days. She reports some swelling in her feet and ankles.

## 2021-12-31 NOTE — ED Provider Notes (Incomplete)
[facility] Emergency Department Provider Note     Event Date/Time   First MD Initiated Contact with Patient 12/31/21 2055     (approximate)   History   Pelvic Pain   HPI  Kristina Welch is a 31 y.o. female with a history of DUB, iron deficiency anemia, and hidradenitis, presents to the ED from local urgent care for evaluation of right-sided pelvic/abdominal pain.  Patient denies any associated vaginal discharge or abnormal vaginal bleeding.  She reports that her symptoms are aggravated by sitting, coughing, and transitioning from stand to sit.  She reports her LMP is 12/20/2021. She does report urinary frequency and pelvic pressure. She denies urgency, hematuria, or retention.    Physical Exam   Triage Vital Signs: ED Triage Vitals [12/31/21 2034]  Enc Vitals Group     BP (!) 134/93     Pulse Rate 72     Resp 16     Temp 98.7 F (37.1 C)     Temp Source Oral     SpO2 98 %     Weight 205 lb 0.4 oz (93 kg)     Height 5\' 3"  (1.6 m)     Head Circumference      Peak Flow      Pain Score      Pain Loc      Pain Edu?      Excl. in [place]?     Most recent vital signs: Vitals:   12/31/21 2034 01/01/22 0007  BP: (!) 134/93 126/80  Pulse: 72 75  Resp: 16 20  Temp: 98.7 F (37.1 C)   SpO2: 98% 98%    General Awake, no distress. NAD HEENT NCAT. PERRL. EOMI. No rhinorrhea. Mucous membranes are moist.  CV:  Good peripheral perfusion.  RESP:  Normal effort.  ABD:  No distention.  Mildly tender to palpation to the right lower quadrant.  No significant guarding, rebound, or rigidity.  Normoactive bowel sounds appreciated.  No CVA tenderness elicited. GU:  Deferred by patient  ED Results / Procedures / Treatments   Labs (all labs ordered are listed, but only abnormal results are displayed) Labs Reviewed  CBC - Abnormal; Notable for the following components:      Result Value   RBC 3.27 (*)    Hemoglobin 9.2 (*)    HCT 29.1 (*)    RDW  17.3 (*)    All other components within normal limits  URINALYSIS, ROUTINE W REFLEX MICROSCOPIC - Abnormal; Notable for the following components:   Color, Urine YELLOW (*)    APPearance HAZY (*)    Ketones, ur 5 (*)    All other components within normal limits  LIPASE, BLOOD  COMPREHENSIVE METABOLIC PANEL  PREGNANCY, URINE     EKG   RADIOLOGY  I personally viewed and evaluated these images as part of my medical decision making, as well as reviewing the written report by the radiologist.  ED Provider Interpretation: no acute findings}  CT ABDOMEN PELVIS WO CONTRAST  Result Date: 12/31/2021 CLINICAL DATA:  Right-sided pelvic pain EXAM: CT ABDOMEN AND PELVIS WITHOUT CONTRAST TECHNIQUE: Multidetector CT imaging of the abdomen and pelvis was performed following the standard protocol without IV contrast. RADIATION DOSE REDUCTION: This exam was performed according to the departmental dose-optimization program which includes automated exposure control, adjustment of the mA and/or kV according to patient size and/or use of iterative reconstruction technique. COMPARISON:  Pelvic ultrasound 12/31/2021 FINDINGS: Lower chest: No acute  abnormality. Hepatobiliary: No focal liver abnormality is seen. No gallstones, gallbladder wall thickening, or biliary dilatation. Pancreas: Unremarkable. No pancreatic ductal dilatation or surrounding inflammatory changes. Spleen: Normal in size without focal abnormality. Adrenals/Urinary Tract: Adrenal glands are unremarkable. Kidneys are normal, without renal calculi, focal lesion, or hydronephrosis. Bladder is unremarkable. Stomach/Bowel: Stomach is within normal limits. Appendix appears normal. No evidence of bowel wall thickening, distention, or inflammatory changes. Vascular/Lymphatic: No significant vascular findings are present. No enlarged abdominal or pelvic lymph nodes. Reproductive: Thickened endometrial stripe measuring up to 22 mm. No adnexal mass. Other:  Negative for pelvic effusion or free air Musculoskeletal: No acute or significant osseous findings. IMPRESSION: 1. No CT evidence for acute intra-abdominal or pelvic abnormality. 2. Thickened endometrial stripe, see same day pelvic ultrasound for additional findings. Electronically Signed   By: Jasmine Pang M.D.   On: 12/31/2021 23:42   US PELVIC COMPLETE W TRANSVAGINAL AND TORSION R/O  Result Date: 12/31/2021 CLINICAL DATA:  Right pelvic pain.  No report of recent bleeding. EXAM: TRANSABDOMINAL AND TRANSVAGINAL ULTRASOUND OF PELVIS DOPPLER ULTRASOUND OF OVARIES TECHNIQUE: Both transabdominal and transvaginal ultrasound examinations of the pelvis were performed. Transabdominal technique was performed for global imaging of the pelvis including uterus, ovaries, adnexal regions, and pelvic cul-de-sac. It was necessary to proceed with endovaginal exam following the transabdominal exam to visualize the ovaries and adnexal spaces as well as the endometrium to a better advantage. Color and duplex Doppler ultrasound was utilized to evaluate blood flow to the ovaries. COMPARISON:  None Available. FINDINGS: Uterus Measurements: Anteverted measuring 10.6 x 5.4 x 6.6 cm = volume: 198 mL. No wall fibroids or other mass visualized. Cervix is unremarkable and measures 3 cm length. Endometrium Thickness: Thickened and heterogeneous measuring 2.4 cm diameter. In the ventral aspect of the proximal uterine cavity there is an echogenic polypoid lesion with trace color flow measuring 6 x 8 x 8 mm. Dorsally in the mid uterine cavity there is another echogenic polypoid structure with color flow measuring 9 x 11 x 9 mm. These could represent endometrial polyps or submucosal fibroids. Right ovary Measurements: 3.6 x 2.2 x 3.0 cm = volume: 12.6 mL. Normal appearance/no adnexal mass. Left ovary Measurements: 3.8 x 1.4 x 2.4 cm = volume: 7.0 mL. Normal appearance/no adnexal mass. Pulsed Doppler evaluation of both ovaries demonstrates  normal low-resistance arterial and venous waveforms. Other findings There is trace anechoic fluid in the pelvic cul-de-sac, nonspecific. There is mild bladder thickening versus underdistention. No adnexal mass is seen. IMPRESSION: 1. There are 2 echogenic polypoid lesions in the uterine cavity along the wall of the cavity, largest is 1.1 cm and both show color flow. These could be due to submucosal fibroids or endometrial polyps. 2. Thickened heterogeneous endometrial complex measuring 2.4 cm. Gyn consult recommended. 3. Cystitis versus bladder nondistention. 4. Trace anechoic free fluid, nonspecific but typically physiologic at this age. Electronically Signed   By: Almira Bar M.D.   On: 12/31/2021 22:01     PROCEDURES:  Critical Care performed: No  Procedures   MEDICATIONS ORDERED IN ED: Medications - No data to display   IMPRESSION / MDM / ASSESSMENT AND PLAN / ED COURSE  I reviewed the triage vital signs and the nursing notes.                              Differential diagnosis includes, but is not limited to, ovarian cyst, ovarian torsion, acute appendicitis, diverticulitis,  urinary tract infection/pyelonephritis, endometriosis, bowel obstruction, colitis, renal colic, gastroenteritis, hernia, fibroids, endometriosis, pregnancy related pain including ectopic pregnancy, etc.   Patient's presentation is most consistent with acute complicated illness / injury requiring diagnostic workup.  Patient with some intermittent right lower domino/pelvic pain.  Patient is evaluated for her complaints in ED, found have reassuring work-up overall.  Labs without any acute leukocytosis but due to her stable iron deficiency anemia.  Patient is afebrile on exam without any other concerning findings.  No radiologic evidence of any intra-abdominal process on noncontrast CT or pelvic ultrasound to explain the patient's symptoms.  Patient's diagnosis is consistent with pelvic pain. Patient will be  discharged home with directions to take OTC ibuprofen or Tylenol as needed. Patient is to follow up with her PCP or GYN as needed or otherwise directed. Patient is given ED precautions to return to the ED for any worsening or new symptoms.     FINAL CLINICAL IMPRESSION(S) / ED DIAGNOSES   Final diagnoses:  Pelvic pain in female     Rx / DC Orders   ED Discharge Orders     None        Note:  This document was prepared using Dragon voice recognition software and may include unintentional dictation errors.    Lissa HoardMenshew, Maison Kestenbaum V Bacon, PA-C 01/01/22 0017

## 2022-01-01 LAB — URINE CULTURE

## 2022-03-11 ENCOUNTER — Ambulatory Visit
Admission: EM | Admit: 2022-03-11 | Discharge: 2022-03-11 | Disposition: A | Discharge status: Home / Self Care | Payer: 59 | Attending: Emergency Medicine | Admitting: Emergency Medicine

## 2022-03-11 VITALS — BP 116/83 | HR 84 | Temp 98.4°F | Ht 63.0 in | Wt 205.0 lb

## 2022-03-11 DIAGNOSIS — R42 Dizziness and giddiness: Secondary | ICD-10-CM | POA: Insufficient documentation

## 2022-03-11 DIAGNOSIS — Z20822 Contact with and (suspected) exposure to covid-19: Secondary | ICD-10-CM | POA: Diagnosis not present

## 2022-03-11 DIAGNOSIS — R232 Flushing: Secondary | ICD-10-CM | POA: Diagnosis not present

## 2022-03-11 DIAGNOSIS — R059 Cough, unspecified: Secondary | ICD-10-CM | POA: Diagnosis present

## 2022-03-11 DIAGNOSIS — J069 Acute upper respiratory infection, unspecified: Secondary | ICD-10-CM

## 2022-03-11 LAB — RESP PANEL BY RT-PCR (FLU A&B, COVID) ARPGX2
Influenza A by PCR: NEGATIVE
Influenza B by PCR: NEGATIVE
SARS Coronavirus 2 by RT PCR: NEGATIVE

## 2022-03-11 MED ORDER — MECLIZINE HCL 12.5 MG PO TABS: 12.5000 mg | ORAL_TABLET | Freq: Three times a day (TID) | ORAL | 0 refills | Status: DC | PRN

## 2022-03-11 MED ORDER — DOXYCYCLINE HYCLATE 100 MG PO CAPS: 100.0000 mg | ORAL_CAPSULE | Freq: Two times a day (BID) | ORAL | 0 refills | Status: DC

## 2022-03-11 NOTE — Discharge Instructions (Signed)
Your symptoms today are most likely being caused by a virus and should steadily improve in time it can take up to 7 to 10 days before you truly start to see a turnaround however things will get better  COVID test is negative, flu test is pending, you will be called for positive test results only, if you do not hear anything by the end of the day your testing is negative  Begin use of doxycycline every morning and every evening for the next 7 days to provide bacterial coverage  For the dizziness you may use meclizine every 8 hours as needed, also change positions slowly waiting at least 10 seconds between movement t0 allow the body to stabilize    You can take Tylenol and/or Ibuprofen as needed for fever reduction and pain relief.   For cough: honey 1/2 to 1 teaspoon (you can dilute the honey in water or another fluid).  You can also use guaifenesin and dextromethorphan for cough. You can use a humidifier for chest congestion and cough.  If you don't have a humidifier, you can sit in the bathroom with the hot shower running.      For sore throat: try warm salt water gargles, cepacol lozenges, throat spray, warm tea or water with lemon/honey, popsicles or ice, or OTC cold relief medicine for throat discomfort.   For congestion: take a daily anti-histamine like Zyrtec, Claritin, and a oral decongestant, such as pseudoephedrine.  You can also use Flonase 1-2 sprays in each nostril daily.   It is important to stay hydrated: drink plenty of fluids (water, gatorade/powerade/pedialyte, juices, or teas) to keep your throat moisturized and help further relieve irritation/discomfort.

## 2022-03-11 NOTE — ED Triage Notes (Signed)
Pt c/o LT ear ringing onset Thursday morning, dizziness on & off since Thursday, facial pressure, pt also c/o base of neck pain when lying down.  Pt states she also has a cough, some wheezing. Intermittent dry & productive cough. Pt reports x2 exposures to covid

## 2022-03-11 NOTE — ED Provider Notes (Signed)
[facility]    CSN: [id number] Arrival date & time: 03/11/22  1010      History   Chief Complaint Chief Complaint  Patient presents with   Dizziness   Tinnitus   Cough    HPI Kristina Welch is a 31 y.o. female.   Patient presents with hot flashes, left-sided ear ringing and pain described as being poked with a needle, intermittent dizziness, facial pain, nonproductive cough, wheezing for 6 days.  Known sick contacts with exposure to COVID, flu and additional viruses.  Tolerating food and liquids.  History of anemia.  Has attempted use of Claritin which has been ineffective.  Past Medical History:  Diagnosis Date   History of iron deficiency anemia     Patient Active Problem List   Diagnosis Date Noted   Urticaria 06/05/2016   DUB (dysfunctional uterine bleeding) 08/29/2015   H/O iron deficiency anemia 08/29/2015   Hidradenitis 08/29/2015    Past Surgical History:  Procedure Laterality Date   CESAREAN SECTION     cyst removed     TONSILLECTOMY      OB History     Gravida  3   Para  2   Term      Preterm      AB      Living         SAB      IAB      Ectopic      Multiple      Live Births               Home Medications    Prior to Admission medications   Medication Sig Start Date End Date Taking? Authorizing Provider  albuterol (VENTOLIN HFA) 108 (90 Base) MCG/ACT inhaler Inhale 2 puffs into the lungs every 4 (four) hours as needed. Q 4h for 5 days then prn cough and wheezing there after 09/22/21   Rodriguez-Southworth, Nettie Elm, PA-C  ferrous sulfate 325 (65 FE) MG tablet Take 325 mg by mouth daily with breakfast.    [provider]  predniSONE (DELTASONE) 50 MG tablet One qd 09/22/21   Rodriguez-Southworth, Nettie Elm, PA-C  fluticasone (FLONASE) 50 MCG/ACT nasal spray Place 2 sprays into both nostrils daily. 10/25/19 04/24/20  Domenick Gong, MD    Family History Family History  Problem Relation Age of Onset    Cancer Mother        remission   Hypertension Mother    Hypertension Father    Healthy Daughter    Healthy Son    Cancer Maternal Grandmother        breast    Social History Social History   Tobacco Use   Smoking status: Never   Smokeless tobacco: Never  Vaping Use   Vaping Use: Never used  Substance Use Topics   Alcohol use: No   Drug use: No     Allergies   Peanut-containing drug products, Amoxicillin-pot clavulanate, and Cetirizine   Review of Systems Review of Systems  Constitutional: Negative.   HENT:  Positive for ear pain and rhinorrhea. Negative for congestion, dental problem, drooling, ear discharge, facial swelling, hearing loss, mouth sores, nosebleeds, postnasal drip, sinus pressure, sinus pain, sneezing, sore throat, tinnitus, trouble swallowing and voice change.   Respiratory:  Positive for cough and wheezing. Negative for apnea, choking, chest tightness, shortness of breath and stridor.   Cardiovascular: Negative.   Gastrointestinal: Negative.   Musculoskeletal:  Positive for neck pain. Negative for arthralgias, back pain, gait problem,  joint swelling, myalgias and neck stiffness.  Skin: Negative.   Neurological:  Positive for dizziness. Negative for tremors, seizures, syncope, facial asymmetry, speech difficulty, weakness, light-headedness and numbness.     Physical Exam Triage Vital Signs ED Triage Vitals  Enc Vitals Group     BP 03/11/22 1020 116/83     Pulse Rate 03/11/22 1020 84     Resp --      Temp 03/11/22 1020 98.4 F (36.9 C)     Temp Source 03/11/22 1020 Oral     SpO2 03/11/22 1020 98 %     Weight 03/11/22 1018 205 lb 0.4 oz (93 kg)     Height 03/11/22 1018 5\' 3"  (1.6 m)     Head Circumference --      Peak Flow --      Pain Score 03/11/22 1018 0     Pain Loc --      Pain Edu? --      Excl. in [place]? --    No data found.  Updated Vital Signs BP 116/83 (BP Location: Left Arm)   Pulse 84   Temp 98.4 F (36.9 C) (Oral)   Ht 5\' 3"   (1.6 m)   Wt 205 lb 0.4 oz (93 kg)   LMP 02/17/2022 (Approximate)   SpO2 98%   BMI 36.32 kg/m   Visual Acuity Right Eye Distance:   Left Eye Distance:   Bilateral Distance:    Right Eye Near:   Left Eye Near:    Bilateral Near:     Physical Exam Constitutional:      Appearance: Normal appearance.  HENT:     Head: Normocephalic.     Right Ear: Tympanic membrane, ear canal and external ear normal.     Left Ear: Tympanic membrane, ear canal and external ear normal.     Nose: Congestion and rhinorrhea present.     Mouth/Throat:     Mouth: Mucous membranes are moist.     Pharynx: Oropharynx is clear.  Eyes:     Extraocular Movements: Extraocular movements intact.  Cardiovascular:     Rate and Rhythm: Normal rate and regular rhythm.     Pulses: Normal pulses.     Heart sounds: Normal heart sounds.  Pulmonary:     Effort: Pulmonary effort is normal.     Breath sounds: Normal breath sounds.  Neurological:     Mental Status: She is alert and oriented to person, place, and time. Mental status is at baseline.  Psychiatric:        Mood and Affect: Mood normal.        Behavior: Behavior normal.      UC Treatments / Results  Labs (all labs ordered are listed, but only abnormal results are displayed) Labs Reviewed  RESP PANEL BY RT-PCR (FLU A&B, COVID) [id number]    EKG   Radiology No results found.  Procedures Procedures (including critical care time)  Medications Ordered in UC Medications - No data to display  Initial Impression / Assessment and Plan / UC Course  I have reviewed the triage vital signs and the nursing notes.  Pertinent labs & imaging results that were available during my care of the patient were reviewed by me and considered in my medical decision making (see chart for details).  Acute upper respiratory infection  Vital signs are stable and patient is in no signs of distress, COVID test is negative, flu test pending, patient has past window for  antiviral treatment, prescribed doxycycline  as symptoms have been present for 7 days with no signs of improvement to provide bacterial coverage and prescribed meclizine for management of dizziness as it is the most worrisome symptom today, advised changing positions slowly for additional measures, may use additional over-the-counter medications as needed, may follow-up with urgent care as needed, work note given Final Clinical Impressions(s) / UC Diagnoses   Final diagnoses:  None   Discharge Instructions   None    ED Prescriptions   None    PDMP not reviewed this encounter.   Valinda Hoar, NP 03/11/22 1146

## 2022-03-13 ENCOUNTER — Ambulatory Visit: Payer: Self-pay

## 2022-04-05 ENCOUNTER — Other Ambulatory Visit: Payer: Self-pay

## 2022-04-05 ENCOUNTER — Emergency Department
Admission: EM | Admit: 2022-04-05 | Discharge: 2022-04-05 | Disposition: A | Discharge status: Home / Self Care | Payer: 59 | Attending: Emergency Medicine | Admitting: Emergency Medicine

## 2022-04-05 DIAGNOSIS — S61012A Laceration without foreign body of left thumb without damage to nail, initial encounter: Secondary | ICD-10-CM | POA: Insufficient documentation

## 2022-04-05 DIAGNOSIS — W268XXA Contact with other sharp object(s), not elsewhere classified, initial encounter: Secondary | ICD-10-CM | POA: Insufficient documentation

## 2022-04-05 NOTE — ED Provider Notes (Signed)
[facility] Provider Note    Event Date/Time   First MD Initiated Contact with Patient 04/05/22 1517     (approximate)   History   Laceration   HPI  Kristina Welch is a 31 y.o. female with no significant past medical history and as listed in EMR presents to the emergency department for treatment and evaluation of left thumb laceration. She was pushing the trash down in the can and cut herself on an aluminum foil cutting strip. Cut bled profusely initially, slowed with pressure and a bandage, but did not completely quit. She went to [facility], but was sent here for definitive management. Tdap is current.      Physical Exam   Triage Vital Signs: ED Triage Vitals  Enc Vitals Group     BP 04/05/22 1410 139/88     Pulse Rate 04/05/22 1410 (!) 109     Resp 04/05/22 1410 18     Temp 04/05/22 1410 98.8 F (37.1 C)     Temp src --      SpO2 04/05/22 1410 96 %     Weight --      Height --      Head Circumference --      Peak Flow --      Pain Score 04/05/22 1409 4     Pain Loc --      Pain Edu? --      Excl. in [place]? --     Most recent vital signs: Vitals:   04/05/22 1410  BP: 139/88  Pulse: (!) 109  Resp: 18  Temp: 98.8 F (37.1 C)  SpO2: 96%    General: Awake, no distress.  CV:  Good peripheral perfusion.  Resp:  Normal effort.  Abd:  No distention.  Other:  1cm laceration to the pad of the left thumb. No active bleeding. Would well approximated.   ED Results / Procedures / Treatments   Labs (all labs ordered are listed, but only abnormal results are displayed) Labs Reviewed - No data to display   EKG     RADIOLOGY  Image and radiology report reviewed by me.  Not indicated.  PROCEDURES:  Critical Care performed: No  ..Laceration Repair  Date/Time: 04/05/2022 5:54 PM  Performed by: Victorino Dike, FNP Authorized by: Victorino Dike, FNP   Consent:    Consent obtained:  Verbal   Consent given  by:  Patient   Risks discussed:  Poor wound healing Universal protocol:    Patient identity confirmed:  Verbally with patient Anesthesia:    Anesthesia method:  None Laceration details:    Location:  Finger   Finger location:  L thumb   Length (cm):  1 Skin repair:    Repair method:  Tissue adhesive Approximation:    Approximation:  Close Repair type:    Repair type:  Simple Post-procedure details:    Dressing:  Adhesive bandage and splint for protection   Procedure completion:  Tolerated well, no immediate complications    MEDICATIONS ORDERED IN ED: Medications - No data to display   IMPRESSION / MDM / [place] / ED COURSE   I have reviewed the triage note.  Differential diagnosis includes, but is not limited to, skin laceration, tendon injury.  31 year old female presenting to the emergency department for treatment and evaluation after accidentally cutting her thumb on a metal strip that was in the trash can.  See HPI for further details.  My  exam, the wound was no longer bleeding and was well approximated.  Bleeding did not recur with cleaning the area.  Dermabond was applied over the wound.  Aluminum foam splint was applied for protection after sufficient drying time.  Wound care was discussed.  Patient is to return to the ER or follow-up with her primary care for any concerns or symptoms.      FINAL CLINICAL IMPRESSION(S) / ED DIAGNOSES   Final diagnoses:  Thumb laceration, left, initial encounter     Rx / DC Orders   ED Discharge Orders     None        Note:  This document was prepared using Dragon voice recognition software and may include unintentional dictation errors.   Chinita Pester, FNP 04/05/22 1756    Pilar Jarvis, MD 04/06/22 1911

## 2022-04-05 NOTE — ED Triage Notes (Signed)
Pt comes with c/o laceration to left thumb. Pt states she was taking trash out and not sure what she cut it on.

## 2022-04-23 ENCOUNTER — Encounter: Payer: Self-pay | Admitting: Intensive Care

## 2022-04-23 ENCOUNTER — Other Ambulatory Visit: Payer: Self-pay

## 2022-04-23 ENCOUNTER — Emergency Department
Admission: EM | Admit: 2022-04-23 | Discharge: 2022-04-23 | Disposition: A | Discharge status: Home / Self Care | Payer: 59 | Attending: Student in an Organized Health Care Education/Training Program | Admitting: Student in an Organized Health Care Education/Training Program

## 2022-04-23 DIAGNOSIS — L02411 Cutaneous abscess of right axilla: Secondary | ICD-10-CM | POA: Diagnosis present

## 2022-04-23 DIAGNOSIS — Z91010 Allergy to peanuts: Secondary | ICD-10-CM | POA: Diagnosis not present

## 2022-04-23 DIAGNOSIS — L732 Hidradenitis suppurativa: Secondary | ICD-10-CM | POA: Diagnosis not present

## 2022-04-23 MED ORDER — DOXYCYCLINE HYCLATE 100 MG PO TABS
100.0000 mg | ORAL_TABLET | Freq: Once | ORAL | Status: AC
  Administered 2022-04-23: 100 mg via ORAL
  Filled 2022-04-23: qty 1

## 2022-04-23 MED ORDER — LIDOCAINE-EPINEPHRINE 1 %-1:100000 IJ SOLN
20.0000 mL | Freq: Once | INTRAMUSCULAR | Status: AC
  Administered 2022-04-23: 20 mL
  Filled 2022-04-23: qty 1

## 2022-04-23 MED ORDER — DOXYCYCLINE MONOHYDRATE 100 MG PO TABS: 100.0000 mg | ORAL_TABLET | Freq: Two times a day (BID) | ORAL | 0 refills | Status: AC

## 2022-04-23 NOTE — ED Provider Notes (Signed)
[facility] EMERGENCY DEPARTMENT Provider Note   CSN: [id number] Arrival date & time: 04/23/22  1729     History  Chief Complaint  Patient presents with   Abscess    Kristina Welch is a 31 y.o. female.  Presents to the emergency department for evaluation of right axilla abscess.  Patient with history of hidradenitis.  Patient states for 3 days she has had increased swelling to her right armpit, swelling and pressure have been significant.  She denies any fevers.  Pain is moderate.  HPI     Home Medications Prior to Admission medications   Medication Sig Start Date End Date Taking? Authorizing Provider  albuterol (VENTOLIN HFA) 108 (90 Base) MCG/ACT inhaler Inhale 2 puffs into the lungs every 4 (four) hours as needed. Q 4h for 5 days then prn cough and wheezing there after 09/22/21   Rodriguez-Southworth, Sunday Spillers, PA-C  doxycycline (VIBRAMYCIN) 100 MG capsule Take 1 capsule (100 mg total) by mouth 2 (two) times daily. 03/11/22   Hans Eden, NP  ferrous sulfate 325 (65 FE) MG tablet Take 325 mg by mouth daily with breakfast.    [provider]  meclizine (ANTIVERT) 12.5 MG tablet Take 1 tablet (12.5 mg total) by mouth 3 (three) times daily as needed for dizziness. 03/11/22   Hans Eden, NP  predniSONE (DELTASONE) 50 MG tablet One qd 09/22/21   Rodriguez-Southworth, Sunday Spillers, PA-C  fluticasone [facility]) 50 MCG/ACT nasal spray Place 2 sprays into both nostrils daily. 10/25/19 04/24/20  Melynda Ripple, MD      Allergies    Peanut-containing drug products, Amoxicillin-pot clavulanate, and Cetirizine    Review of Systems   Review of Systems  Physical Exam Updated Vital Signs BP 127/79 (BP Location: Left Arm)   Pulse 85   Temp 98.9 F (37.2 C) (Oral)   Resp 16   Ht 5\' 3"  (1.6 m)   Wt 93.5 kg   SpO2 96%   BMI 36.51 kg/m  Physical Exam Constitutional:      Appearance: She is well-developed.  HENT:     Head: Normocephalic and  atraumatic.  Eyes:     Conjunctiva/sclera: Conjunctivae normal.  Cardiovascular:     Rate and Rhythm: Normal rate.  Pulmonary:     Effort: Pulmonary effort is normal. No respiratory distress.  Musculoskeletal:        General: Normal range of motion.     Cervical back: Normal range of motion.     Comments: Right axilla with fluctuant 4 x 2 cm area of tissue, abscess is fluctuant, minimally erythematous.  No drainage.  Painful active range of motion of the right shoulder.  Skin:    General: Skin is warm.     Findings: No rash.  Neurological:     General: No focal deficit present.     Mental Status: She is alert and oriented to person, place, and time.  Psychiatric:        Mood and Affect: Mood normal.        Behavior: Behavior normal.        Thought Content: Thought content normal.     ED Results / Procedures / Treatments   Labs (all labs ordered are listed, but only abnormal results are displayed) Labs Reviewed - No data to display  EKG None  Radiology No results found.  Procedures .Marland KitchenIncision and Drainage  Date/Time: 04/23/2022 6:37 PM  Performed by: Duanne Guess, PA-C Authorized by: Duanne Guess, PA-C  Consent:    Consent obtained:  Verbal   Consent given by:  Patient   Risks discussed:  Incomplete drainage Universal protocol:    Patient identity confirmed:  Verbally with patient Location:    Type:  Abscess   Size:  4 x 2   Location: Right axilla. Pre-procedure details:    Skin preparation:  Povidone-iodine Sedation:    Sedation type:  None Anesthesia:    Anesthesia method:  Local infiltration   Local anesthetic:  Lidocaine 1% WITH epi Procedure type:    Complexity:  Simple Procedure details:    Incision types:  Stab incision   Incision depth:  Dermal   Wound management:  Probed and deloculated   Drainage:  Purulent   Drainage amount:  Moderate   Wound treatment:  Drain placed   Packing materials:  1/4 in iodoform gauze   Amount 1/4"  iodoform:  2 inches, 1 inch within the wound Post-procedure details:    Procedure completion:  Tolerated    Medications Ordered in ED Medications  doxycycline (VIBRA-TABS) tablet 100 mg (has no administration in time range)  lidocaine-EPINEPHrine (XYLOCAINE W/EPI) 1 %-1:100000 (with pres) injection 20 mL (has no administration in time range)    ED Course/ Medical Decision Making/ A&P                           Medical Decision Making Risk Prescription drug management.   31 year old female with hidradenitis.  For several days she has had increased pain and swelling to the right axillary area, she has a large fluctuant abscess present.  No active drainage.  She is afebrile, vital signs are stable.  Pain moderate to severe due to swelling and pressure along the right axilla abscess.  Patient agreed consented to right axilla I&D.  Patient tolerated procedure well.  Significant purulent drainage was removed and abscess completely decompressed.  Pain significantly improved as well as arm range of motion post procedure.  Patient is placed on doxycycline.  Recommend follow-up with general surgery.  She understands signs symptoms return to the ER for. Final Clinical Impression(s) / ED Diagnoses Final diagnoses:  Abscess of axilla, right  Hydradenitis    Rx / DC Orders ED Discharge Orders     None         Ronnette Juniper 04/23/22 1839    Willy Eddy, MD 04/23/22 [phone]

## 2022-04-23 NOTE — ED Triage Notes (Signed)
Patient presents with abscess under right axilla. Denies drainage. Hx same

## 2022-04-23 NOTE — Discharge Instructions (Signed)
Please remove packing in 2 days.  You may shower beginning tomorrow morning and change bandage daily.  Take antibiotics as prescribed.  Return for any increased pain, swelling, warmth, redness or fevers.  Follow-up with general surgery.

## 2022-04-23 NOTE — ED Notes (Signed)
ED Provider at bedside. 

## 2022-05-05 ENCOUNTER — Other Ambulatory Visit: Payer: Self-pay

## 2022-05-05 ENCOUNTER — Encounter: Payer: Self-pay | Admitting: Emergency Medicine

## 2022-05-05 ENCOUNTER — Emergency Department
Admission: EM | Admit: 2022-05-05 | Discharge: 2022-05-05 | Discharge status: Against Medical Advice | Payer: 59 | Attending: Emergency Medicine | Admitting: Emergency Medicine

## 2022-05-05 DIAGNOSIS — L02411 Cutaneous abscess of right axilla: Secondary | ICD-10-CM | POA: Insufficient documentation

## 2022-05-05 DIAGNOSIS — Z5321 Procedure and treatment not carried out due to patient leaving prior to being seen by health care provider: Secondary | ICD-10-CM | POA: Diagnosis not present

## 2022-05-05 NOTE — ED Provider Triage Note (Signed)
Emergency Medicine Provider Triage Evaluation Note  Yonna Alwin Danziger, a 31 y.o. female  was evaluated in triage.  Pt complains of right axilla abscess recollection.  She was seen last week for an I&D procedure, and reports the packing came out too soon, causing the abscess to recollect.  She presents with some purulent drainage as well as some fullness to the area but denies any fevers, chills, sweats.  Review of Systems  Positive: Axilla abscess Negative: FCS  Physical Exam  BP 127/80 (BP Location: Left Arm)   Pulse 71   Temp 98.3 F (36.8 C) (Oral)   Resp 16   SpO2 100%  Gen:   Awake, no distress  NAD Resp:  Normal effort CTA MSK:   Moves extremities without difficulty  Other:    Medical Decision Making  Medically screening exam initiated at 6:12 PM.  Appropriate orders placed.  Tezra [place] was informed that the remainder of the evaluation will be completed by another provider, this initial triage assessment does not replace that evaluation, and the importance of remaining in the ED until their evaluation is complete.  Patient to the ED for wound check of recently I&D'd abscess.    Melvenia Needles, PA-C 05/05/22 1814

## 2022-05-05 NOTE — ED Triage Notes (Signed)
Pt to ED via POV for Abscess under her right arm. Pt states that she had it drained a few weeks ago but the packing fell out before it healed. Pt noticed on Sunday that it was the abscess was draining again. Pt denies fever but states that she has had some chills.

## 2022-05-05 NOTE — ED Notes (Signed)
No answer when called several times from lobby 
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
## 2022-05-05 NOTE — ED Notes (Signed)
No answer when called several times from lobby; no answer when phone # listed on chart called 

## 2022-05-10 ENCOUNTER — Emergency Department
Admission: EM | Admit: 2022-05-10 | Discharge: 2022-05-10 | Disposition: A | Discharge status: Home / Self Care | Payer: 59 | Attending: Emergency Medicine | Admitting: Emergency Medicine

## 2022-05-10 ENCOUNTER — Encounter: Payer: Self-pay | Admitting: Emergency Medicine

## 2022-05-10 ENCOUNTER — Other Ambulatory Visit: Payer: Self-pay

## 2022-05-10 DIAGNOSIS — L732 Hidradenitis suppurativa: Secondary | ICD-10-CM | POA: Insufficient documentation

## 2022-05-10 DIAGNOSIS — L02411 Cutaneous abscess of right axilla: Secondary | ICD-10-CM | POA: Diagnosis present

## 2022-05-10 HISTORY — DX: Hidradenitis suppurativa: L73.2

## 2022-05-10 MED ORDER — DOXYCYCLINE MONOHYDRATE 100 MG PO TABS: 100.0000 mg | ORAL_TABLET | Freq: Two times a day (BID) | ORAL | 0 refills | Status: AC

## 2022-05-10 MED ORDER — CLINDAMYCIN PHOSPHATE 1 % EX GEL: Freq: Two times a day (BID) | CUTANEOUS | 0 refills | Status: DC

## 2022-05-10 NOTE — Discharge Instructions (Addendum)
I have given you the name of a dermatologist at [facility] that specializes in hidradenitis suppurativa, please call the office to schedule an appointment.  Take the doxycycline twice a day for the next 7 days.  I will also prescribe clindamycin gel which she should put on the armpit twice a day.

## 2022-05-10 NOTE — ED Triage Notes (Signed)
Right axilla abscess draining.  States has been draining since last seen.  Has history of hydradenitis.

## 2022-05-10 NOTE — ED Provider Notes (Signed)
[facility] Provider Note    Event Date/Time   First MD Initiated Contact with Patient 05/10/22 1232     (approximate)   History   Abscess   HPI  Kristina Welch is a 31 y.o. female past medical history of hidradenitis who presents with an abscess of the right axilla.  Patient tells me she was seen in the ED about 3 weeks ago and had an abscess lanced.  It was packed packing fell out she was prescribed doxycycline.  However since that time is continued to drain.  It is not painful.  She will wake up and will be drainage on her shirt.  She was told by her work that she should get it reevaluated.  Does not currently see a dermatologist or take any treatment for her at bedtime.  Denies fevers or chills.     Past Medical History:  Diagnosis Date   Hydradenitis     There are no problems to display for this patient.    Physical Exam  Triage Vital Signs: ED Triage Vitals  Enc Vitals Group     BP 05/10/22 1117 122/80     Pulse Rate 05/10/22 1117 60     Resp 05/10/22 1117 18     Temp 05/10/22 1117 98.4 F (36.9 C)     Temp Source 05/10/22 1117 Oral     SpO2 05/10/22 1117 100 %     Weight 05/10/22 1116 206 lb 2.1 oz (93.5 kg)     Height 05/10/22 1116 5\' 3"  (1.6 m)     Head Circumference --      Peak Flow --      Pain Score 05/10/22 1107 6     Pain Loc --      Pain Edu? --      Excl. in [place]? --     Most recent vital signs: Vitals:   05/10/22 1117 05/10/22 1236  BP: 122/80 118/74  Pulse: 60 66  Resp: 18 17  Temp: 98.4 F (36.9 C) 98.1 F (36.7 C)  SpO2: 100% 99%     General: Awake, no distress.  CV:  Good peripheral perfusion.  Resp:  Normal effort.  Abd:  No distention.  Neuro:             Awake, Alert, Oriented x 3  Other:  Right axilla with a area of inflamed mucosa with some scant purulent drainage that is nontender nonfluctuant with no surrounding erythema   ED Results / Procedures / Treatments  Labs (all labs ordered are  listed, but only abnormal results are displayed) Labs Reviewed - No data to display   EKG     RADIOLOGY    PROCEDURES:  Critical Care performed: No  Procedures   MEDICATIONS ORDERED IN ED: Medications - No data to display   IMPRESSION / MDM / ASSESSMENT AND PLAN / ED COURSE  I reviewed the triage vital signs and the nursing notes.                              Patient's presentation is most consistent with acute, uncomplicated illness.  Differential diagnosis includes, but is not limited to, cellulitis, abscess, exacerbation of at bedtime  Patient is a 31 year old female with at bedtime who presents with ongoing drainage from a at bedtime lesion that was I&D about 3 weeks ago.  She has no associated pain or systemic symptoms.  However she is bothered  by the ongoing drainage from the area.  On exam at the top of the axilla there is an area of somewhat inflamed looking mucosa that does have some scant purulent drainage however it is nonfluctuant and not tender and I have low suspicion for abscess.  Not feel that I&D will be worthwhile today.  Suspect that this is an at bedtime lesion.  Will prescribe a course of doxycycline and topical clindamycin.  I have given her referral to [facility] dermatology as I think she would benefit from ongoing care for her at bedtime.       FINAL CLINICAL IMPRESSION(S) / ED DIAGNOSES   Final diagnoses:  Hydradenitis     Rx / DC Orders   ED Discharge Orders          Ordered    doxycycline (ADOXA) 100 MG tablet  2 times daily        05/10/22 1249    clindamycin (CLINDAGEL) 1 % gel  2 times daily        05/10/22 1249             Note:  This document was prepared using Dragon voice recognition software and may include unintentional dictation errors.   Rada Hay, MD 05/10/22 1252

## 2022-05-12 ENCOUNTER — Ambulatory Visit
Admission: EM | Admit: 2022-05-12 | Discharge: 2022-05-12 | Disposition: A | Discharge status: Home / Self Care | Payer: 59 | Attending: Urgent Care | Admitting: Urgent Care

## 2022-05-12 ENCOUNTER — Ambulatory Visit: Admit: 2022-05-12 | Payer: 59

## 2022-05-12 ENCOUNTER — Encounter: Payer: Self-pay | Admitting: Emergency Medicine

## 2022-05-12 DIAGNOSIS — H8111 Benign paroxysmal vertigo, right ear: Secondary | ICD-10-CM | POA: Diagnosis present

## 2022-05-12 DIAGNOSIS — D649 Anemia, unspecified: Secondary | ICD-10-CM | POA: Insufficient documentation

## 2022-05-12 LAB — CBC WITH DIFFERENTIAL/PLATELET
Abs Immature Granulocytes: 0.01 10*3/uL (ref 0.00–0.07)
Basophils Absolute: 0 10*3/uL (ref 0.0–0.1)
Basophils Relative: 1 %
Eosinophils Absolute: 0.1 10*3/uL (ref 0.0–0.5)
Eosinophils Relative: 4 %
HCT: 29.9 % — ABNORMAL LOW (ref 36.0–46.0)
Hemoglobin: 9.5 g/dL — ABNORMAL LOW (ref 12.0–15.0)
Immature Granulocytes: 0 %
Lymphocytes Relative: 55 %
Lymphs Abs: 2.1 10*3/uL (ref 0.7–4.0)
MCH: 28.6 pg (ref 26.0–34.0)
MCHC: 31.8 g/dL (ref 30.0–36.0)
MCV: 90.1 fL (ref 80.0–100.0)
Monocytes Absolute: 0.3 10*3/uL (ref 0.1–1.0)
Monocytes Relative: 8 %
Neutro Abs: 1.2 10*3/uL — ABNORMAL LOW (ref 1.7–7.7)
Neutrophils Relative %: 32 %
Platelets: 343 10*3/uL (ref 150–400)
RBC: 3.32 MIL/uL — ABNORMAL LOW (ref 3.87–5.11)
RDW: 14.9 % (ref 11.5–15.5)
WBC: 3.8 10*3/uL — ABNORMAL LOW (ref 4.0–10.5)
nRBC: 0 % (ref 0.0–0.2)

## 2022-05-12 MED ORDER — MECLIZINE HCL 25 MG PO TABS
25.0000 mg | ORAL_TABLET | Freq: Three times a day (TID) | ORAL | 0 refills | Status: DC | PRN
Start: 2022-05-12 — End: 2022-12-05

## 2022-05-12 NOTE — Discharge Instructions (Addendum)
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  Please read the attached handouts on this. Please perform the Epley maneuver per the instruction sheet provided.  Do this at least once daily. Take the meclizine as prescribed on an as-needed basis.  You do have an anemia, but it is improved from your last lab result. I do recommend possibly following up with a gynecologist, and consider having a pelvic ultrasound to look for uterine fibroids.  This is a very common cause of heavy menstrual bleeding and anemia.

## 2022-05-12 NOTE — ED Triage Notes (Addendum)
Pt c/o dizziness. Started about 3 days ago. She states it is worse when laying down and when light shines around her. She states when she is laying down she feels like the room is spinning but when she is up walking around she feels like she is spinning. She states she has had a slight headache/pressure. Denies blurred vision or numbness however she states yesterday she was seeing floaters. Pt states she has had this in the past and is always around the starting of her menstrual cycle.

## 2022-05-12 NOTE — ED Provider Notes (Signed)
[facility]    CSN: [id number] Arrival date & time: 05/12/22  1138      History   Chief Complaint Chief Complaint  Patient presents with   Dizziness    HPI Kristina Welch is a 31 y.o. female.   Pleasant 31 year old female presents today due to concerns of dizziness that started initially on Friday.  States previously it was mild but has worsened.  Reports that frequent head movements particularly to the right seem to be worsening her symptoms.  She reported some sensitivity to light previously, but denies any actively.  She reports a sensation of a tight hat on her head, but denies a severe headache.  No nuchal rigidity or fever.  No gait instability or falls.  No tinnitus or vision changes.  Patient is actively on her menstrual period, has a history of anemia.  Is not actively on treatment for this, previously was taking iron.  Denies having a work-up.  Is wearing 2 pads and has to change every other hour. Denies pica. No treatments tried for current sx. Was just put on doxycycline 2 days ago for hidradenitis suppurativa flare.  Patient has taken in the past and never had dizziness on this medication.   Dizziness   Past Medical History:  Diagnosis Date   History of iron deficiency anemia    Hydradenitis     Patient Active Problem List   Diagnosis Date Noted   Urticaria 06/05/2016   DUB (dysfunctional uterine bleeding) 08/29/2015   H/O iron deficiency anemia 08/29/2015   Hidradenitis 08/29/2015    Past Surgical History:  Procedure Laterality Date   CESAREAN SECTION     cyst removed     TONSILLECTOMY      OB History     Gravida  3   Para  2   Term  0   Preterm  0   AB  0   Living         SAB  0   IAB  0   Ectopic  0   Multiple      Live Births               Home Medications    Prior to Admission medications   Medication Sig Start Date End Date Taking? Authorizing Provider  meclizine (ANTIVERT) 25 MG tablet Take 1  tablet (25 mg total) by mouth 3 (three) times daily as needed for dizziness. 05/12/22  Yes Tinna Kolker L, PA  albuterol (VENTOLIN HFA) 108 (90 Base) MCG/ACT inhaler Inhale 2 puffs into the lungs every 4 (four) hours as needed. Q 4h for 5 days then prn cough and wheezing there after 09/22/21   Rodriguez-Southworth, Nettie Elm, PA-C  doxycycline (ADOXA) 100 MG tablet Take 1 tablet (100 mg total) by mouth 2 (two) times daily for 7 days. 05/10/22 05/17/22  Georga Hacking, MD  fluticasone (FLONASE) 50 MCG/ACT nasal spray Place 2 sprays into both nostrils daily. 10/25/19 04/24/20  Domenick Gong, MD    Family History Family History  Problem Relation Age of Onset   Cancer Mother        remission   Hypertension Mother    Hypertension Father    Healthy Daughter    Healthy Son    Cancer Maternal Grandmother        breast    Social History Social History   Tobacco Use   Smoking status: Never   Smokeless tobacco: Never  Vaping Use   Vaping Use: Never used  Substance Use Topics   Alcohol use: No   Drug use: No     Allergies   Peanut-containing drug products, Augmentin [amoxicillin-pot clavulanate], Peanut (diagnostic), Zyrtec [cetirizine], Amoxicillin-pot clavulanate, and Cetirizine   Review of Systems Review of Systems  Neurological:  Positive for dizziness.  As per HPI   Physical Exam Triage Vital Signs ED Triage Vitals  Enc Vitals Group     BP 05/12/22 1313 119/82     Pulse Rate 05/12/22 1313 (!) 58     Resp 05/12/22 1313 16     Temp 05/12/22 1313 98.5 F (36.9 C)     Temp Source 05/12/22 1313 Oral     SpO2 05/12/22 1313 99 %     Weight 05/12/22 1311 206 lb 2.1 oz (93.5 kg)     Height 05/12/22 1311 5\' 3"  (1.6 m)     Head Circumference --      Peak Flow --      Pain Score 05/12/22 1310 6     Pain Loc --      Pain Edu? --      Excl. in GC? --    No data found.  Updated Vital Signs BP 119/82 (BP Location: Left Arm)   Pulse (!) 58   Temp 98.5 F (36.9 C) (Oral)    Resp 16   Ht 5\' 3"  (1.6 m)   Wt 206 lb 2.1 oz (93.5 kg)   LMP 05/12/2022 (Approximate)   SpO2 99%   BMI 36.51 kg/m   Visual Acuity Right Eye Distance:   Left Eye Distance:   Bilateral Distance:    Right Eye Near:   Left Eye Near:    Bilateral Near:     Physical Exam Vitals and nursing note reviewed.  Constitutional:      General: She is not in acute distress.    Appearance: She is well-developed. She is obese. She is not ill-appearing, toxic-appearing or diaphoretic.  HENT:     Head: Normocephalic and atraumatic.     Right Ear: Tympanic membrane, ear canal and external ear normal.     Left Ear: Tympanic membrane, ear canal and external ear normal.     Nose: Nose normal. No congestion or rhinorrhea.     Mouth/Throat:     Mouth: Mucous membranes are moist.     Pharynx: Oropharynx is clear. No oropharyngeal exudate or posterior oropharyngeal erythema.  Eyes:     General: No scleral icterus.       Right eye: No discharge.        Left eye: No discharge.     Extraocular Movements: Extraocular movements intact.     Conjunctiva/sclera: Conjunctivae normal.     Pupils: Pupils are equal, round, and reactive to light.     Comments: Minimal conjunctival pallor  Cardiovascular:     Rate and Rhythm: Normal rate and regular rhythm.     Heart sounds: No murmur heard. Pulmonary:     Effort: Pulmonary effort is normal. No respiratory distress.     Breath sounds: Normal breath sounds.  Abdominal:     Palpations: Abdomen is soft.     Tenderness: There is no abdominal tenderness.  Musculoskeletal:        General: No swelling. Normal range of motion.     Cervical back: Normal range of motion and neck supple. No rigidity or tenderness.     Right lower leg: No edema.     Left lower leg: No edema.  Lymphadenopathy:  Cervical: No cervical adenopathy.  Skin:    General: Skin is warm and dry.     Capillary Refill: Capillary refill takes less than 2 seconds.     Coloration: Skin is  not jaundiced.     Findings: No erythema or rash.  Neurological:     General: No focal deficit present.     Mental Status: She is alert and oriented to person, place, and time. Mental status is at baseline.     Cranial Nerves: Cranial nerves 2-12 are intact. No cranial nerve deficit or facial asymmetry.     Sensory: Sensation is intact. No sensory deficit.     Motor: Motor function is intact. No weakness, tremor, atrophy or pronator drift.     Coordination: Coordination is intact. Romberg sign negative. Coordination normal. Finger-Nose-Finger Test normal.     Gait: Gait is intact. Gait normal.     Deep Tendon Reflexes: Reflexes are normal and symmetric. Reflexes normal.     Comments: Positive modified Dix Hallpike on the R  Psychiatric:        Mood and Affect: Mood normal.        Behavior: Behavior normal.      UC Treatments / Results  Labs (all labs ordered are listed, but only abnormal results are displayed) Labs Reviewed  CBC WITH DIFFERENTIAL/PLATELET - Abnormal; Notable for the following components:      Result Value   WBC 3.8 (*)    RBC 3.32 (*)    Hemoglobin 9.5 (*)    HCT 29.9 (*)    Neutro Abs 1.2 (*)    All other components within normal limits    EKG   Radiology No results found.  Procedures Procedures (including critical care time)  Medications Ordered in UC Medications - No data to display  Initial Impression / Assessment and Plan / UC Course  I have reviewed the triage vital signs and the nursing notes.  Pertinent labs & imaging results that were available during my care of the patient were reviewed by me and considered in my medical decision making (see chart for details).     BPPV -patient with reproducible symptoms, and Nystagmus noted with modified Dix-Hallpike on the right.  This is highly suggestive of BPPV.  We will send patient home with the Epley maneuvers.  We will also have patient do trial of meclizine. Anemia -I do not feel that her  hemoglobin of 9.5 is the active cause of her symptoms.  This however does require a further work-up.  I encouraged her to establish care with a gynecologist and perform a further work-up for her dysfunctional uterine bleeding.   Final Clinical Impressions(s) / UC Diagnoses   Final diagnoses:  BPPV (benign paroxysmal positional vertigo), right  Anemia, unspecified type     Discharge Instructions      I suspect your dizziness to be related to BPPV.  Please read the attached handouts on this. Please perform the Epley maneuver per the instruction sheet provided.  Do this at least once daily. Take the meclizine as prescribed on an as-needed basis.  You do have an anemia, but it is improved from your last lab result. I do recommend possibly following up with a gynecologist, and consider having a pelvic ultrasound to look for uterine fibroids.  This is a very common cause of heavy menstrual bleeding and anemia.    ED Prescriptions     Medication Sig Dispense Auth. Provider   meclizine (ANTIVERT) 25 MG tablet Take 1  tablet (25 mg total) by mouth 3 (three) times daily as needed for dizziness. 30 tablet Field Staniszewski L, [place]      PDMP not reviewed this encounter.   Maretta Bees, [place] 05/12/22 [phone]

## 2022-08-01 ENCOUNTER — Emergency Department (HOSPITAL_COMMUNITY): Payer: 59

## 2022-08-01 ENCOUNTER — Ambulatory Visit: Payer: Self-pay

## 2022-08-01 ENCOUNTER — Emergency Department (HOSPITAL_COMMUNITY)
Admission: EM | Admit: 2022-08-01 | Discharge: 2022-08-02 | Discharge status: Against Medical Advice | Payer: 59 | Attending: Emergency Medicine | Admitting: Emergency Medicine

## 2022-08-01 ENCOUNTER — Other Ambulatory Visit: Payer: Self-pay

## 2022-08-01 ENCOUNTER — Encounter (HOSPITAL_COMMUNITY): Payer: Self-pay | Admitting: *Deleted

## 2022-08-01 DIAGNOSIS — Z5321 Procedure and treatment not carried out due to patient leaving prior to being seen by health care provider: Secondary | ICD-10-CM | POA: Insufficient documentation

## 2022-08-01 DIAGNOSIS — R059 Cough, unspecified: Secondary | ICD-10-CM | POA: Insufficient documentation

## 2022-08-01 DIAGNOSIS — Z20822 Contact with and (suspected) exposure to covid-19: Secondary | ICD-10-CM | POA: Insufficient documentation

## 2022-08-01 DIAGNOSIS — M542 Cervicalgia: Secondary | ICD-10-CM | POA: Diagnosis not present

## 2022-08-01 LAB — RESP PANEL BY RT-PCR (RSV, FLU A&B, COVID)  RVPGX2
Influenza A by PCR: NEGATIVE
Influenza B by PCR: NEGATIVE
Resp Syncytial Virus by PCR: NEGATIVE
SARS Coronavirus 2 by RT PCR: NEGATIVE

## 2022-08-01 NOTE — ED Provider Triage Note (Signed)
  Emergency Medicine Provider Triage Evaluation Note  MRN:  [id number]  Arrival date & time: 08/01/22    Medically screening exam initiated at 10:23 PM.   CC:   Cough   HPI:  Kristina Welch is a 32 y.o. year-old female presents to the ED with chief complaint of back pain.  Onset 2 days ago.  Reports generalized body aches and sneezing as well. Denies fever.  Has been around patients with covid and flu.  History provided by patient. ROS:  -As included in HPI PE:   Vitals:   08/01/22 2211  BP: 127/79  Pulse: 88  Resp: 17  Temp: 97.9 F (36.6 C)  SpO2: 100%    Non-toxic appearing No respiratory distress  MDM:  Based on signs and symptoms, URI or CAP is highest on my differential, followed by [facility]. I've ordered labs and CXR in triage to expedite lab/diagnostic workup.  Patient was informed that the remainder of the evaluation will be completed by another provider, this initial triage assessment does not replace that evaluation, and the importance of remaining in the ED until their evaluation is complete.   Montine Circle, PA-C 08/01/22 2225

## 2022-08-01 NOTE — ED Triage Notes (Signed)
The pt has a productive cough for 2 days and some neck pain she works at [employer] and has been around patients with covid and rsv  lmp [id number]

## 2022-08-02 ENCOUNTER — Ambulatory Visit
Admission: EM | Admit: 2022-08-02 | Discharge: 2022-08-02 | Disposition: A | Discharge status: Home / Self Care | Payer: 59 | Attending: Emergency Medicine | Admitting: Emergency Medicine

## 2022-08-02 DIAGNOSIS — J069 Acute upper respiratory infection, unspecified: Secondary | ICD-10-CM

## 2022-08-02 MED ORDER — ALBUTEROL SULFATE HFA 108 (90 BASE) MCG/ACT IN AERS: 1.0000 | INHALATION_SPRAY | Freq: Four times a day (QID) | RESPIRATORY_TRACT | 0 refills | Status: AC | PRN

## 2022-08-02 MED ORDER — PREDNISONE 10 MG PO TABS: 40.0000 mg | ORAL_TABLET | Freq: Every day | ORAL | 0 refills | Status: AC

## 2022-08-02 NOTE — ED Provider Notes (Signed)
[facility]    CSN: [id number] Arrival date & time: 08/02/22  5916      History   Chief Complaint Chief Complaint  Patient presents with   Wheezing    HPI Kristina Welch is a 32 y.o. female.  Patient presents with congestion, runny nose, sneezing, cough, wheezing, mid back pain with cough and deep breaths x 2 days.  No falls or injury.  No fever,  chest pain, shortness of breath, or other symptoms.  Patient went to [facility] ED yesterday but left before being seen due to wait time; chest x-ray negative; negative for COVID, flu, RSV.  She denies history of lung disease but states she has used an albuterol inhaler in the past with similar symptoms.  She denies current pregnancy or breastfeeding.     The history is provided by the patient and medical records.    Past Medical History:  Diagnosis Date   History of iron deficiency anemia    Hydradenitis     Patient Active Problem List   Diagnosis Date Noted   Urticaria 06/05/2016   DUB (dysfunctional uterine bleeding) 08/29/2015   H/O iron deficiency anemia 08/29/2015   Hidradenitis 08/29/2015    Past Surgical History:  Procedure Laterality Date   CESAREAN SECTION     cyst removed     TONSILLECTOMY      OB History     Gravida  3   Para  2   Term  0   Preterm  0   AB  0   Living         SAB  0   IAB  0   Ectopic  0   Multiple      Live Births               Home Medications    Prior to Admission medications   Medication Sig Start Date End Date Taking? Authorizing Provider  albuterol (VENTOLIN HFA) 108 (90 Base) MCG/ACT inhaler Inhale 1-2 puffs into the lungs every 6 (six) hours as needed. 08/02/22  Yes Sharion Balloon, NP  meclizine (ANTIVERT) 25 MG tablet Take 1 tablet (25 mg total) by mouth 3 (three) times daily as needed for dizziness. Patient not taking: Reported on 08/01/2022 05/12/22   Geryl Councilman L, PA  predniSONE (DELTASONE) 10 MG tablet Take 4 tablets (40 mg total) by  mouth daily for 3 days. 08/02/22 08/05/22 Yes Sharion Balloon, NP  fluticasone (FLONASE) 50 MCG/ACT nasal spray Place 2 sprays into both nostrils daily. 10/25/19 04/24/20  Melynda Ripple, MD    Family History Family History  Problem Relation Age of Onset   Cancer Mother        remission   Hypertension Mother    Hypertension Father    Healthy Daughter    Healthy Son    Cancer Maternal Grandmother        breast    Social History Social History   Tobacco Use   Smoking status: Never   Smokeless tobacco: Never  Vaping Use   Vaping Use: Never used  Substance Use Topics   Alcohol use: No   Drug use: No     Allergies   Peanut-containing drug products, Augmentin [amoxicillin-pot clavulanate], Peanut (diagnostic), Zyrtec [cetirizine], Amoxicillin-pot clavulanate, and Cetirizine   Review of Systems Review of Systems  Constitutional:  Negative for chills and fever.  HENT:  Positive for congestion, postnasal drip, rhinorrhea and sneezing. Negative for ear pain and sore throat.  Respiratory:  Positive for cough and wheezing. Negative for shortness of breath.   Cardiovascular:  Negative for chest pain and palpitations.  Gastrointestinal:  Negative for abdominal pain, diarrhea and vomiting.  Musculoskeletal:  Positive for back pain. Negative for arthralgias, gait problem and joint swelling.  Skin:  Negative for color change and rash.  All other systems reviewed and are negative.    Physical Exam Triage Vital Signs ED Triage Vitals  Enc Vitals Group     BP 08/02/22 0911 111/74     Pulse Rate 08/02/22 0847 80     Resp 08/02/22 0847 18     Temp 08/02/22 0847 97.9 F (36.6 C)     Temp src --      SpO2 08/02/22 0847 98 %     Weight 08/02/22 0908 205 lb 0.4 oz (93 kg)     Height 08/02/22 0908 5\' 3"  (1.6 m)     Head Circumference --      Peak Flow --      Pain Score 08/02/22 0906 10     Pain Loc --      Pain Edu? --      Excl. in GC? --    No data found.  Updated Vital  Signs BP 111/74   Pulse 80   Temp 97.9 F (36.6 C)   Resp 18   Ht 5\' 3"  (1.6 m)   Wt 205 lb 0.4 oz (93 kg)   LMP 07/07/2022   SpO2 98%   BMI 36.32 kg/m   Visual Acuity Right Eye Distance:   Left Eye Distance:   Bilateral Distance:    Right Eye Near:   Left Eye Near:    Bilateral Near:     Physical Exam Vitals and nursing note reviewed.  Constitutional:      General: She is not in acute distress.    Appearance: Normal appearance. She is well-developed. She is not ill-appearing.  HENT:     Right Ear: Tympanic membrane normal.     Left Ear: Tympanic membrane normal.     Nose: Nose normal.     Mouth/Throat:     Mouth: Mucous membranes are moist.     Pharynx: Oropharynx is clear.  Cardiovascular:     Rate and Rhythm: Normal rate and regular rhythm.     Heart sounds: Normal heart sounds.  Pulmonary:     Effort: Pulmonary effort is normal. No respiratory distress.     Breath sounds: Normal breath sounds. No wheezing.  Musculoskeletal:     Cervical back: Neck supple.  Skin:    General: Skin is warm and dry.  Neurological:     Mental Status: She is alert.  Psychiatric:        Mood and Affect: Mood normal.        Behavior: Behavior normal.      UC Treatments / Results  Labs (all labs ordered are listed, but only abnormal results are displayed) Labs Reviewed - No data to display  EKG   Radiology DG Chest 2 View  Result Date: 08/01/2022 CLINICAL DATA:  Back pain EXAM: CHEST - 2 VIEW COMPARISON:  09/22/2021 FINDINGS: The heart size and mediastinal contours are within normal limits. Both lungs are clear. Mild scoliosis IMPRESSION: No active cardiopulmonary disease. Electronically Signed   By: 09/30/2022 M.D.   On: 08/01/2022 22:45    Procedures Procedures (including critical care time)  Medications Ordered in UC Medications - No data to display  Initial Impression /  Assessment and Plan / UC Course  I have reviewed the triage vital signs and the nursing  notes.  Pertinent labs & imaging results that were available during my care of the patient were reviewed by me and considered in my medical decision making (see chart for details).    Viral URI.  Negative chest x-ray and negative COVID, flu, RSV  at [facility] ED yesterday; patient left before being seen due to wait time.  She is afebrile and vital signs are stable.  Lungs are clear, O2 sat 98% on room air.  Treating with albuterol inhaler and prednisone.  Education provided on viral respiratory infection.  Instructed patient to follow up with her PCP if her symptoms are not improving.  She agrees to plan of care.    Final Clinical Impressions(s) / UC Diagnoses   Final diagnoses:  Viral URI     Discharge Instructions      Take the prednisone and use the albuterol inhaler as directed.  Follow up with your primary care provider if your symptoms are not improving.         ED Prescriptions     Medication Sig Dispense Auth. Provider   albuterol (VENTOLIN HFA) 108 (90 Base) MCG/ACT inhaler Inhale 1-2 puffs into the lungs every 6 (six) hours as needed. 18 g Sharion Balloon, NP   predniSONE (DELTASONE) 10 MG tablet Take 4 tablets (40 mg total) by mouth daily for 3 days. 12 tablet Sharion Balloon, NP      PDMP not reviewed this encounter.   Sharion Balloon, NP 08/02/22 0930

## 2022-08-02 NOTE — ED Provider Notes (Signed)
Pt left prior to being seen. I did not participate in the care of this patient.   Fransico Meadow, MD 08/02/22 [phone]

## 2022-08-02 NOTE — ED Triage Notes (Signed)
Patient to Urgent Care with complaints of wheezing. Reports some pain in her lungs when she tries to catch her breath. Reports the pain is coming from her back. Denies any known injury. Reports that she was sitting at work when the back pain started spontaneously.  Denies hx of asthma. Symptoms started two days ago.   Negative covid/ flu/ RSV tests in the Emergency Department yesterday.

## 2022-08-02 NOTE — ED Notes (Signed)
Pt called for vitals multiple times, no response. Not seen in lobby

## 2022-08-02 NOTE — Discharge Instructions (Addendum)
Take the prednisone and use the albuterol inhaler as directed.  Follow up with your primary care provider if your symptoms are not improving.    

## 2022-12-03 ENCOUNTER — Other Ambulatory Visit: Payer: Self-pay

## 2022-12-03 ENCOUNTER — Ambulatory Visit: Admit: 2022-12-03 | Payer: 59

## 2022-12-03 ENCOUNTER — Emergency Department
Admission: EM | Admit: 2022-12-03 | Discharge: 2022-12-03 | Disposition: A | Discharge status: Home / Self Care | Payer: 59 | Attending: Emergency Medicine | Admitting: Emergency Medicine

## 2022-12-03 ENCOUNTER — Encounter: Payer: Self-pay | Admitting: Emergency Medicine

## 2022-12-03 DIAGNOSIS — R42 Dizziness and giddiness: Secondary | ICD-10-CM | POA: Insufficient documentation

## 2022-12-03 DIAGNOSIS — R059 Cough, unspecified: Secondary | ICD-10-CM | POA: Diagnosis not present

## 2022-12-03 DIAGNOSIS — R519 Headache, unspecified: Secondary | ICD-10-CM

## 2022-12-03 LAB — CBC
HCT: 28.9 % — ABNORMAL LOW (ref 36.0–46.0)
Hemoglobin: 9.1 g/dL — ABNORMAL LOW (ref 12.0–15.0)
MCH: 27.2 pg (ref 26.0–34.0)
MCHC: 31.5 g/dL (ref 30.0–36.0)
MCV: 86.5 fL (ref 80.0–100.0)
Platelets: 368 10*3/uL (ref 150–400)
RBC: 3.34 MIL/uL — ABNORMAL LOW (ref 3.87–5.11)
RDW: 15.8 % — ABNORMAL HIGH (ref 11.5–15.5)
WBC: 3.5 10*3/uL — ABNORMAL LOW (ref 4.0–10.5)
nRBC: 0 % (ref 0.0–0.2)

## 2022-12-03 LAB — BASIC METABOLIC PANEL
Anion gap: 7 (ref 5–15)
BUN: 9 mg/dL (ref 6–20)
CO2: 24 mmol/L (ref 22–32)
Calcium: 8.9 mg/dL (ref 8.9–10.3)
Chloride: 105 mmol/L (ref 98–111)
Creatinine, Ser: 0.72 mg/dL (ref 0.44–1.00)
GFR, Estimated: >60 mL/min (ref >60)
Glucose, Bld: 94 mg/dL (ref 70–99)
Potassium: 3.9 mmol/L (ref 3.5–5.1)
Sodium: 136 mmol/L (ref 135–145)

## 2022-12-03 MED ORDER — KETOROLAC TROMETHAMINE 60 MG/2ML IM SOLN
15.0000 mg | Freq: Once | INTRAMUSCULAR | Status: AC
  Administered 2022-12-03: 15 mg via INTRAMUSCULAR
  Filled 2022-12-03: qty 2

## 2022-12-03 NOTE — ED Triage Notes (Signed)
Pt via POV from home. Pt c/o headache that has been constant for the past week. States that dizziness comes and goes. Pt states she does felt nauseous. States she does have a hx of vertigo but this does not feel similar. Pt is A&Ox4 and NAD

## 2022-12-03 NOTE — ED Provider Notes (Signed)
[facility] Provider Note    Event Date/Time   First MD Initiated Contact with Patient 12/03/22 1138     (approximate)   History   Headache and Dizziness   HPI  Kristina Welch is a 32 y.o. female with history of vertigo presenting to the emergency department for evaluation of headache and dizziness.  Patient reports that for the last week she has had some cough and upper respiratory congestion.  She additionally reports onset of a headache worse on her right side with associated nausea without vomiting.  This feels different than the typical vertigo that she gets.  No neck stiffness.  No history of trauma.  No numbness, tingling, weakness.  No vision changes or double vision.  Was having symptoms at work so she was directed to the ER.    Physical Exam   Triage Vital Signs: ED Triage Vitals  Enc Vitals Group     BP 12/03/22 1043 (!) 126/97     Pulse Rate 12/03/22 1043 75     Resp 12/03/22 1043 18     Temp 12/03/22 1043 98.2 F (36.8 C)     Temp Source 12/03/22 1043 Oral     SpO2 12/03/22 1043 100 %     Weight 12/03/22 1040 212 lb 11.9 oz (96.5 kg)     Height 12/03/22 1040 5\' 3"  (1.6 m)     Head Circumference --      Peak Flow --      Pain Score 12/03/22 1040 9     Pain Loc --      Pain Edu? --      Excl. in GC? --     Most recent vital signs: Vitals:   12/03/22 1043  BP: (!) 126/97  Pulse: 75  Resp: 18  Temp: 98.2 F (36.8 C)  SpO2: 100%     General: Awake, interactive, sitting on chair on phone CV:  Regular rate, good peripheral perfusion.  Resp:  Lungs clear, unlabored respirations.  Abd:  Soft, nondistended.  Neuro:  Alert and oriented, pupils equal and reactive, normal extraocular movements, symmetric facial movement, sensation intact over bilateral upper and lower extremities with 5 out of 5 strength.  Normal finger-to-nose testing.   ED Results / Procedures / Treatments   Labs (all labs ordered are listed, but only  abnormal results are displayed) Labs Reviewed  CBC - Abnormal; Notable for the following components:      Result Value   WBC 3.5 (*)    RBC 3.34 (*)    Hemoglobin 9.1 (*)    HCT 28.9 (*)    RDW 15.8 (*)    All other components within normal limits  BASIC METABOLIC PANEL  URINALYSIS, ROUTINE W REFLEX MICROSCOPIC  POC URINE PREG, ED     EKG EKG independently reviewed interpreted by myself (ER attending) demonstrates:  EKG demonstrates normal sinus rhythm at a rate of 63, PR 150, QRS 76, QTc 409, no acute ST changes  RADIOLOGY Imaging independently reviewed and interpreted by myself demonstrates:    PROCEDURES:  Critical Care performed: No  Procedures   MEDICATIONS ORDERED IN ED: Medications  ketorolac (TORADOL) injection 15 mg (15 mg Intramuscular Given 12/03/22 1255)     IMPRESSION / MDM / ASSESSMENT AND PLAN / ED COURSE  I reviewed the triage vital signs and the nursing notes.  Differential diagnosis includes, but is not limited to, anemia, electrolyte abnormality, viral illness, migraine, tension headache. No fevers, neck stiffness, or  altered mental status suggestive of meningitis, encephalitis, or intracranial abscess. Headache was not sudden in onset or worst headache of life making SAH less likely. No trauma or focal deficits suggestive of acute intracranial bleed or cervical artery dissection. No focal deficits making venous sinus thrombosis, intracranial mass, and IIH less likely.   Patient's presentation is most consistent with acute illness / injury with system symptoms.  32 year old female presenting to the emergency department for evaluation of headache and dizziness.  Lab work here with stable anemia.  Electrolytes reassuring.  EKG reassuring.  Patient is overall well-appearing here.  She does plan to drive home and reports only mild symptoms currently.  Do not think there is an indication for head imaging currently.  I did discuss strict return precautions and  patient is comfortable with plan for discharge.  She was discharged in stable condition.     FINAL CLINICAL IMPRESSION(S) / ED DIAGNOSES   Final diagnoses:  Acute nonintractable headache, unspecified headache type     Rx / DC Orders   ED Discharge Orders     None        Note:  This document was prepared using Dragon voice recognition software and may include unintentional dictation errors.   Trinna Post, MD 12/03/22 [phone]

## 2022-12-03 NOTE — Discharge Instructions (Signed)
You were seen in the ER today for evaluation of your headache. Your exam here was fortunately reassuring. Please arrange follow-up with a primary care doctor within the next few days for reevaluation if your symptoms have not improved. Return to the ER if you develop new or worsening headache, fever, neck stiffness, changes in vision, difficulty walking, weakness, dizziness, confusion, vomiting, numbness, tingling, or any other new or concerning symptoms that you believe warrants immediate attention.

## 2022-12-05 ENCOUNTER — Encounter: Payer: Self-pay | Admitting: Emergency Medicine

## 2022-12-05 ENCOUNTER — Ambulatory Visit
Admission: EM | Admit: 2022-12-05 | Discharge: 2022-12-05 | Disposition: A | Discharge status: Home / Self Care | Payer: 59 | Attending: Physician Assistant | Admitting: Physician Assistant

## 2022-12-05 DIAGNOSIS — J019 Acute sinusitis, unspecified: Secondary | ICD-10-CM | POA: Insufficient documentation

## 2022-12-05 DIAGNOSIS — Z1152 Encounter for screening for COVID-19: Secondary | ICD-10-CM | POA: Diagnosis not present

## 2022-12-05 DIAGNOSIS — R42 Dizziness and giddiness: Secondary | ICD-10-CM

## 2022-12-05 DIAGNOSIS — R519 Headache, unspecified: Secondary | ICD-10-CM | POA: Diagnosis not present

## 2022-12-05 LAB — SARS CORONAVIRUS 2 BY RT PCR: SARS Coronavirus 2 by RT PCR: NEGATIVE

## 2022-12-05 MED ORDER — DOXYCYCLINE HYCLATE 100 MG PO CAPS: 100.0000 mg | ORAL_CAPSULE | Freq: Two times a day (BID) | ORAL | 0 refills | Status: AC

## 2022-12-05 MED ORDER — MECLIZINE HCL 25 MG PO TABS: 25.0000 mg | ORAL_TABLET | Freq: Three times a day (TID) | ORAL | 0 refills | Status: DC | PRN

## 2022-12-05 NOTE — Discharge Instructions (Addendum)
-  Switch to Claritin-D and start using Flonase every day.  Ibuprofen and/or Tylenol as needed for headache. - I sent meclizine in case you start to feel dizzy you can take that. - If no improvement in the next few days, start the antibiotic. - If your headache worsens go to the ER.

## 2022-12-05 NOTE — ED Provider Notes (Signed)
[facility]    CSN: [id number] Arrival date & time: 12/05/22  1342      History   Chief Complaint Chief Complaint  Patient presents with   Headache    HPI Wavie Kadish Hayton is a 32 y.o. female presenting for approximately 1.5-week history of nasal congestion, sinus pressure and pain to the right side of her face, right-sided headaches, fatigue and mild dizziness.  She was seen in the ER couple days ago and symptoms worsened.  Reports they gave her a Toradol injection and it helped initially but then made her nauseous.  She says she is feeling okay until symptoms seem to get worse today.  She does have a history of allergies and takes Claritin every day.  Does not use any nasal sprays.  She has not had any fevers.  Reports a occasional clearing of her throat and will have yellowish-green postnasal drainage.  Patient reports her nose is swollen and she is not really blowing a lot out her nose.  She is not complaining of any speech or balance problems, numbness/tingling or weakness, facial drooping, chest pain, shortness of breath.  Reports a history of vertigo.  No other concerns.  HPI  Past Medical History:  Diagnosis Date   History of iron deficiency anemia    Hydradenitis     Patient Active Problem List   Diagnosis Date Noted   Urticaria 06/05/2016   DUB (dysfunctional uterine bleeding) 08/29/2015   H/O iron deficiency anemia 08/29/2015   Hidradenitis 08/29/2015    Past Surgical History:  Procedure Laterality Date   CESAREAN SECTION     cyst removed     TONSILLECTOMY      OB History     Gravida  3   Para  2   Term  0   Preterm  0   AB  0   Living         SAB  0   IAB  0   Ectopic  0   Multiple      Live Births               Home Medications    Prior to Admission medications   Medication Sig Start Date End Date Taking? Authorizing Provider  doxycycline (VIBRAMYCIN) 100 MG capsule Take 1 capsule (100 mg total) by mouth 2  (two) times daily for 7 days. 12/05/22 12/12/22 Yes Shirlee Latch, PA-C  albuterol (VENTOLIN HFA) 108 (90 Base) MCG/ACT inhaler Inhale 1-2 puffs into the lungs every 6 (six) hours as needed. 08/02/22   Mickie Bail, NP  meclizine (ANTIVERT) 25 MG tablet Take 1 tablet (25 mg total) by mouth 3 (three) times daily as needed for dizziness. 12/05/22   Shirlee Latch, PA-C  fluticasone (FLONASE) 50 MCG/ACT nasal spray Place 2 sprays into both nostrils daily. 10/25/19 04/24/20  Domenick Gong, MD    Family History Family History  Problem Relation Age of Onset   Cancer Mother        remission   Hypertension Mother    Hypertension Father    Healthy Daughter    Healthy Son    Cancer Maternal Grandmother        breast    Social History Social History   Tobacco Use   Smoking status: Never   Smokeless tobacco: Never  Vaping Use   Vaping Use: Never used  Substance Use Topics   Alcohol use: No   Drug use: No     Allergies  Peanut-containing drug products, Augmentin [amoxicillin-pot clavulanate], Peanut (diagnostic), Zyrtec [cetirizine], Amoxicillin-pot clavulanate, and Cetirizine   Review of Systems Review of Systems  Constitutional:  Negative for chills, diaphoresis, fatigue and fever.  HENT:  Positive for congestion, rhinorrhea, sinus pressure and sinus pain. Negative for ear pain and sore throat.   Respiratory:  Negative for cough and shortness of breath.   Gastrointestinal:  Negative for abdominal pain, nausea and vomiting.  Musculoskeletal:  Negative for arthralgias and myalgias.  Skin:  Negative for rash.  Neurological:  Positive for dizziness and headaches. Negative for weakness.  Hematological:  Negative for adenopathy.     Physical Exam Triage Vital Signs ED Triage Vitals  Enc Vitals Group     BP 12/05/22 1409 121/86     Pulse Rate 12/05/22 1409 74     Resp 12/05/22 1409 14     Temp 12/05/22 1409 98.3 F (36.8 C)     Temp Source 12/05/22 1409 Oral     SpO2  12/05/22 1409 97 %     Weight 12/05/22 1407 209 lb 14.1 oz (95.2 kg)     Height 12/05/22 1407 5\' 3"  (1.6 m)     Head Circumference --      Peak Flow --      Pain Score 12/05/22 1406 8     Pain Loc --      Pain Edu? --      Excl. in GC? --    No data found.  Updated Vital Signs BP 121/86 (BP Location: Left Arm)   Pulse 74   Temp 98.3 F (36.8 C) (Oral)   Resp 14   Ht 5\' 3"  (1.6 m)   Wt 209 lb 14.1 oz (95.2 kg)   LMP 11/19/2022 (Approximate)   SpO2 97%   BMI 37.18 kg/m       Physical Exam Vitals and nursing note reviewed.  Constitutional:      General: She is not in acute distress.    Appearance: Normal appearance. She is not ill-appearing or toxic-appearing.  HENT:     Head: Normocephalic and atraumatic.     Right Ear: Ear canal and external ear normal. A middle ear effusion is present.     Left Ear: Ear canal and external ear normal. A middle ear effusion is present.     Nose: Congestion present.     Mouth/Throat:     Mouth: Mucous membranes are moist.     Pharynx: Oropharynx is clear.  Eyes:     General: No scleral icterus.       Right eye: No discharge.        Left eye: No discharge.     Extraocular Movements: Extraocular movements intact.     Conjunctiva/sclera: Conjunctivae normal.     Pupils: Pupils are equal, round, and reactive to light.  Cardiovascular:     Rate and Rhythm: Normal rate and regular rhythm.     Heart sounds: Normal heart sounds.  Pulmonary:     Effort: Pulmonary effort is normal. No respiratory distress.     Breath sounds: Normal breath sounds.  Musculoskeletal:     Cervical back: Neck supple.  Skin:    General: Skin is dry.  Neurological:     General: No focal deficit present.     Mental Status: She is alert and oriented to person, place, and time. Mental status is at baseline.     Cranial Nerves: No cranial nerve deficit.     Motor: No weakness.  Coordination: Coordination normal.     Gait: Gait normal.  Psychiatric:         Mood and Affect: Mood normal.        Behavior: Behavior normal.        Thought Content: Thought content normal.      UC Treatments / Results  Labs (all labs ordered are listed, but only abnormal results are displayed) Labs Reviewed  SARS CORONAVIRUS 2 BY RT PCR    EKG   Radiology No results found.  Procedures Procedures (including critical care time)  Medications Ordered in UC Medications - No data to display  Initial Impression / Assessment and Plan / UC Course  I have reviewed the triage vital signs and the nursing notes.  Pertinent labs & imaging results that were available during my care of the patient were reviewed by me and considered in my medical decision making (see chart for details).   32 year old female presents for right-sided headache, right sided facial pain, nasal congestion, mild dizziness and fatigue for greater than a week and a half.  Seen in the ED 2 days ago for symptoms.  Given Toradol injection.  Initial improvement in her headache but recent worsening.  Has been taking Claritin.  Vitals are normal and stable and she is overall well-appearing.  She does have a lot of nasal congestion and polyps in her nose, clear effusion of TMs.  Normal neuroexam.  Symptoms likely due to sinus headache.  Will treat at this time with having her switch to Claritin D and begin using Flonase.  Printed prescription for doxycycline in case she is not feeling better in a few more days.  Prescription for meclizine in case dizziness gets worse given her history of vertigo.  Reviewed going back to the emergency department if her headache worsens or she has any red flag signs or symptoms which we discussed.   Final Clinical Impressions(s) / UC Diagnoses   Final diagnoses:  Acute sinusitis, recurrence not specified, unspecified location  Acute nonintractable headache, unspecified headache type  Dizziness     Discharge Instructions      -Switch to Claritin-D and start using  Flonase every day.  Ibuprofen and/or Tylenol as needed for headache. - I sent meclizine in case you start to feel dizzy you can take that. - If no improvement in the next few days, start the antibiotic. - If your headache worsens go to the ER.    ED Prescriptions     Medication Sig Dispense Auth. Provider   meclizine (ANTIVERT) 25 MG tablet Take 1 tablet (25 mg total) by mouth 3 (three) times daily as needed for dizziness. 30 tablet Eusebio Friendly B, PA-C   doxycycline (VIBRAMYCIN) 100 MG capsule Take 1 capsule (100 mg total) by mouth 2 (two) times daily for 7 days. 14 capsule Shirlee Latch, PA-C      PDMP not reviewed this encounter.   Shirlee Latch, PA-C 12/05/22 1513

## 2022-12-05 NOTE — ED Triage Notes (Signed)
Patient c/o nasal congestion, sinus pressure on the right side of her face and headaches for the past 2-3 days.

## 2023-03-12 ENCOUNTER — Ambulatory Visit
Admission: EM | Admit: 2023-03-12 | Discharge: 2023-03-12 | Disposition: A | Discharge status: Home / Self Care | Payer: 59 | Attending: Internal Medicine | Admitting: Internal Medicine

## 2023-03-12 DIAGNOSIS — J0100 Acute maxillary sinusitis, unspecified: Secondary | ICD-10-CM | POA: Diagnosis not present

## 2023-03-12 MED ORDER — FLUTICASONE PROPIONATE 50 MCG/ACT NA SUSP: 1.0000 | Freq: Every day | NASAL | 2 refills | Status: AC

## 2023-03-12 MED ORDER — GUAIFENESIN ER 600 MG PO TB12: 600.0000 mg | ORAL_TABLET | Freq: Two times a day (BID) | ORAL | 0 refills | Status: AC

## 2023-03-12 NOTE — Discharge Instructions (Addendum)
Please maintain adequate hydration Saline nasal spray in addition to the prescribed medications may help Humidifier use and VapoRub use at bedtime will help with nasal congestion and postnasal drainage Warm salt water gargle will help with throat discomfort and ear discomfort. No indication for COVID-19 testing Return to urgent care if you have worsening symptoms.

## 2023-03-12 NOTE — ED Triage Notes (Signed)
Pt c/o left ear "full of fluid", sneezing, and nasal drainage x3days  Pt states that she had chest congestion last week but it has gone away. Pt states that the phlegm she has now is green.

## 2023-03-12 NOTE — ED Provider Notes (Signed)
[facility]    CSN: [id number] Arrival date & time: 03/12/23  1652      History   Chief Complaint Chief Complaint  Patient presents with   Nasal Congestion    HPI Kristina Welch is a 32 y.o. female presents to the urgent care with nasal congestion, postnasal drainage and left ear discomfort.  Patient symptoms started about 3 days ago.  She has a history of allergic rhinitis but is unable to tolerate cetirizine.  She denies any fever or chills.  No sick contacts.  Patient has greenish nasal discharge.  No shortness of breath or wheezing.  No persistent cough or sputum production.  No chest pain or chest pressure. No recent travel. HPI  Past Medical History:  Diagnosis Date   History of iron deficiency anemia    Hydradenitis     Patient Active Problem List   Diagnosis Date Noted   Urticaria 06/05/2016   DUB (dysfunctional uterine bleeding) 08/29/2015   H/O iron deficiency anemia 08/29/2015   Hidradenitis 08/29/2015    Past Surgical History:  Procedure Laterality Date   CESAREAN SECTION     cyst removed     TONSILLECTOMY      OB History     Gravida  3   Para  2   Term  0   Preterm  0   AB  0   Living         SAB  0   IAB  0   Ectopic  0   Multiple      Live Births               Home Medications    Prior to Admission medications   Medication Sig Start Date End Date Taking? Authorizing Provider  fluticasone (FLONASE) 50 MCG/ACT nasal spray Place 1 spray into both nostrils daily. 03/12/23  Yes Zaineb Nowaczyk, Britta Mccreedy, MD  guaiFENesin (MUCINEX) 600 MG 12 hr tablet Take 1 tablet (600 mg total) by mouth 2 (two) times daily for 10 days. 03/12/23 03/22/23 Yes Sagan Wurzel, Britta Mccreedy, MD  albuterol (VENTOLIN HFA) 108 (90 Base) MCG/ACT inhaler Inhale 1-2 puffs into the lungs every 6 (six) hours as needed. 08/02/22   Mickie Bail, NP  meclizine (ANTIVERT) 25 MG tablet Take 1 tablet (25 mg total) by mouth 3 (three) times daily as needed for  dizziness. 12/05/22   Shirlee Latch, PA-C    Family History Family History  Problem Relation Age of Onset   Cancer Mother        remission   Hypertension Mother    Hypertension Father    Healthy Daughter    Healthy Son    Cancer Maternal Grandmother        breast    Social History Social History   Tobacco Use   Smoking status: Never   Smokeless tobacco: Never  Vaping Use   Vaping status: Never Used  Substance Use Topics   Alcohol use: No   Drug use: No     Allergies   Peanut-containing drug products, Augmentin [amoxicillin-pot clavulanate], Peanut (diagnostic), Zyrtec [cetirizine], Amoxicillin-pot clavulanate, and Cetirizine   Review of Systems Review of Systems As per HPI  Physical Exam Triage Vital Signs ED Triage Vitals  Encounter Vitals Group     BP 03/12/23 1714 123/77     Systolic BP Percentile --      Diastolic BP Percentile --      Pulse Rate 03/12/23 1714 66  Resp --      Temp 03/12/23 1714 98.5 F (36.9 C)     Temp Source 03/12/23 1714 Oral     SpO2 03/12/23 1714 100 %     Weight 03/12/23 1713 212 lb 11.9 oz (96.5 kg)     Height 03/12/23 1713 5\' 3"  (1.6 m)     Head Circumference --      Peak Flow --      Pain Score 03/12/23 1713 0     Pain Loc --      Pain Education --      Exclude from Growth Chart --    No data found.  Updated Vital Signs BP 123/77 (BP Location: Left Arm)   Pulse 66   Temp 98.5 F (36.9 C) (Oral)   Ht 5\' 3"  (1.6 m)   Wt 96.5 kg   LMP 02/23/2023   SpO2 100%   BMI 37.69 kg/m   Visual Acuity Right Eye Distance:   Left Eye Distance:   Bilateral Distance:    Right Eye Near:   Left Eye Near:    Bilateral Near:     Physical Exam Vitals and nursing note reviewed.  Constitutional:      General: She is not in acute distress.    Appearance: Normal appearance. She is not ill-appearing.  HENT:     Right Ear: Tympanic membrane normal.     Left Ear: Tympanic membrane normal.     Mouth/Throat:     Mouth:  Mucous membranes are moist.     Pharynx: No oropharyngeal exudate or posterior oropharyngeal erythema.  Eyes:     Pupils: Pupils are equal, round, and reactive to light.  Cardiovascular:     Rate and Rhythm: Normal rate and regular rhythm.     Pulses: Normal pulses.     Heart sounds: Normal heart sounds.  Pulmonary:     Effort: Pulmonary effort is normal.     Breath sounds: Normal breath sounds.  Abdominal:     General: Bowel sounds are normal.     Palpations: Abdomen is soft.  Musculoskeletal:        General: Normal range of motion.  Neurological:     Mental Status: She is alert.      UC Treatments / Results  Labs (all labs ordered are listed, but only abnormal results are displayed) Labs Reviewed - No data to display  EKG   Radiology No results found.  Procedures Procedures (including critical care time)  Medications Ordered in UC Medications - No data to display  Initial Impression / Assessment and Plan / UC Course  I have reviewed the triage vital signs and the nursing notes.  Pertinent labs & imaging results that were available during my care of the patient were reviewed by me and considered in my medical decision making (see chart for details).     1.  Acute maxillary sinusitis: This is likely viral or allergy related Saline nasal spray as needed No indication for COVID-19 testing Fluticasone nasal spray Mucinex 600 mg twice daily as needed Return to urgent care if symptoms worsens. Final Clinical Impressions(s) / UC Diagnoses   Final diagnoses:  Acute maxillary sinusitis, recurrence not specified     Discharge Instructions      Please maintain adequate hydration Saline nasal spray in addition to the prescribed medications may help Humidifier use and VapoRub use at bedtime will help with nasal congestion and postnasal drainage Warm salt water gargle will help with throat discomfort and  ear discomfort. No indication for COVID-19 testing Return  to urgent care if you have worsening symptoms.    ED Prescriptions     Medication Sig Dispense Auth. Provider   fluticasone (FLONASE) 50 MCG/ACT nasal spray Place 1 spray into both nostrils daily. 16 g Merrilee Jansky, MD   guaiFENesin (MUCINEX) 600 MG 12 hr tablet Take 1 tablet (600 mg total) by mouth 2 (two) times daily for 10 days. 20 tablet Lasean Gorniak, Britta Mccreedy, MD      PDMP not reviewed this encounter.   Merrilee Jansky, MD 03/12/23 [phone]

## 2023-03-21 ENCOUNTER — Encounter: Payer: Self-pay | Admitting: *Deleted

## 2023-03-21 ENCOUNTER — Other Ambulatory Visit: Payer: Self-pay

## 2023-03-21 ENCOUNTER — Ambulatory Visit
Admission: EM | Admit: 2023-03-21 | Discharge: 2023-03-21 | Disposition: A | Discharge status: Home / Self Care | Payer: 59

## 2023-03-21 DIAGNOSIS — H9311 Tinnitus, right ear: Secondary | ICD-10-CM

## 2023-03-21 NOTE — ED Provider Notes (Signed)
[facility]    CSN: [id number] Arrival date & time: 03/21/23  1453      History   Chief Complaint Chief Complaint  Patient presents with   Ear Problem    HPI Kristina Welch is a 32 y.o. female.   HPI Patient here today with complaint of abnormal sound involving the right ear. She reports a "swooshing sound" which she characterizes as a "fetal heartbeat. sound". Patient reports this is a new problem. No recent medication changes or taking any OTC medication that would attribute to ringing in the ear. No recent ear infection or history ET tube dysfunction.  Ears are non-painful and she is no experiencing any abnormal sounds or loss of hearing in either ear. Past Medical History:  Diagnosis Date   History of iron deficiency anemia    Hydradenitis     Patient Active Problem List   Diagnosis Date Noted   Urticaria 06/05/2016   DUB (dysfunctional uterine bleeding) 08/29/2015   H/O iron deficiency anemia 08/29/2015   Hidradenitis 08/29/2015    Past Surgical History:  Procedure Laterality Date   CESAREAN SECTION     cyst removed     TONSILLECTOMY      OB History     Gravida  3   Para  2   Term  0   Preterm  0   AB  0   Living         SAB  0   IAB  0   Ectopic  0   Multiple      Live Births               Home Medications    Prior to Admission medications   Medication Sig Start Date End Date Taking? Authorizing Provider  fluticasone (FLONASE) 50 MCG/ACT nasal spray Place 1 spray into both nostrils daily. 03/12/23  Yes Lamptey, Britta Mccreedy, MD  albuterol (VENTOLIN HFA) 108 (90 Base) MCG/ACT inhaler Inhale 1-2 puffs into the lungs every 6 (six) hours as needed. 08/02/22   Mickie Bail, NP  meclizine (ANTIVERT) 25 MG tablet Take 1 tablet (25 mg total) by mouth 3 (three) times daily as needed for dizziness. 12/05/22   Shirlee Latch, PA-C    Family History Family History  Problem Relation Age of Onset   Cancer Mother         remission   Hypertension Mother    Hypertension Father    Healthy Daughter    Healthy Son    Cancer Maternal Grandmother        breast    Social History Social History   Tobacco Use   Smoking status: Never   Smokeless tobacco: Never  Vaping Use   Vaping status: Never Used  Substance Use Topics   Alcohol use: No   Drug use: No     Allergies   Peanut-containing drug products, Augmentin [amoxicillin-pot clavulanate], Peanut (diagnostic), Zyrtec [cetirizine], Amoxicillin-pot clavulanate, and Cetirizine   Review of Systems Review of Systems Pertinent negatives listed in HPI   Physical Exam Triage Vital Signs ED Triage Vitals [03/21/23 1652]  Encounter Vitals Group     BP      Systolic BP Percentile      Diastolic BP Percentile      Pulse      Resp      Temp      Temp src      SpO2      Weight  Height      Head Circumference      Peak Flow      Pain Score 0     Pain Loc      Pain Education      Exclude from Growth Chart    No data found.  Updated Vital Signs BP 113/80   Pulse 73   Temp 98.8 F (37.1 C)   LMP 03/21/2023   SpO2 99%   Visual Acuity Right Eye Distance:   Left Eye Distance:   Bilateral Distance:    Right Eye Near:   Left Eye Near:    Bilateral Near:     Physical Exam Vitals reviewed.  Constitutional:      Appearance: Normal appearance.  HENT:     Head: Normocephalic and atraumatic.     Right Ear: Tympanic membrane, ear canal and external ear normal. There is no impacted cerumen.     Left Ear: Tympanic membrane, ear canal and external ear normal. There is no impacted cerumen.     Nose: Nose normal.     Mouth/Throat:     Pharynx: No oropharyngeal exudate or posterior oropharyngeal erythema.  Eyes:     Extraocular Movements: Extraocular movements intact.     Pupils: Pupils are equal, round, and reactive to light.  Cardiovascular:     Rate and Rhythm: Normal rate and regular rhythm.  Pulmonary:     Effort: Pulmonary effort  is normal.     Breath sounds: Normal breath sounds.  Musculoskeletal:     Cervical back: Normal range of motion and neck supple.  Skin:    General: Skin is warm and dry.     Capillary Refill: Capillary refill takes less than 2 seconds.  Neurological:     General: No focal deficit present.     Mental Status: She is alert.      UC Treatments / Results  Labs (all labs ordered are listed, but only abnormal results are displayed) Labs Reviewed - No data to display  EKG   Radiology No results found.  Procedures Procedures (including critical care time)  Medications Ordered in UC Medications - No data to display  Initial Impression / Assessment and Plan / UC Course  I have reviewed the triage vital signs and the nursing notes.  Pertinent labs & imaging results that were available during my care of the patient were reviewed by me and considered in my medical decision making (see chart for details).    Tinnitus of right ear, no identifiable cause. Recommended allowing a few more days for symptoms to resolve, if no improvement follow-up with PCP to discuss a audiology vs ENT referral. Final Clinical Impressions(s) / UC Diagnoses   Final diagnoses:  Tinnitus of right ear   Discharge Instructions   None    ED Prescriptions   None    PDMP not reviewed this encounter.   Bing Neighbors, NP 03/24/23 [phone]

## 2023-03-21 NOTE — ED Triage Notes (Signed)
Pt reports a swooshing in Rt ear and some times sounds like a fetal heart beat.

## 2023-04-01 ENCOUNTER — Other Ambulatory Visit: Payer: Self-pay

## 2023-04-01 ENCOUNTER — Emergency Department
Admission: EM | Admit: 2023-04-01 | Discharge: 2023-04-01 | Disposition: A | Discharge status: Home / Self Care | Payer: 59 | Attending: Emergency Medicine | Admitting: Emergency Medicine

## 2023-04-01 DIAGNOSIS — R42 Dizziness and giddiness: Secondary | ICD-10-CM | POA: Insufficient documentation

## 2023-04-01 DIAGNOSIS — J069 Acute upper respiratory infection, unspecified: Secondary | ICD-10-CM | POA: Insufficient documentation

## 2023-04-01 DIAGNOSIS — R059 Cough, unspecified: Secondary | ICD-10-CM | POA: Diagnosis present

## 2023-04-01 DIAGNOSIS — Z1152 Encounter for screening for COVID-19: Secondary | ICD-10-CM | POA: Insufficient documentation

## 2023-04-01 LAB — SARS CORONAVIRUS 2 BY RT PCR: SARS Coronavirus 2 by RT PCR: NEGATIVE

## 2023-04-01 MED ORDER — MECLIZINE HCL 25 MG PO TABS: 25.0000 mg | ORAL_TABLET | Freq: Three times a day (TID) | ORAL | 0 refills | Status: AC | PRN

## 2023-04-01 NOTE — Discharge Instructions (Addendum)
Take acetaminophen 650 mg and ibuprofen 400 mg every 6 hours for pain.  Take with food.  Thank you for choosing us for your health care today!  Please see your primary doctor this week for a follow up appointment.   If you have any new, worsening, or unexpected symptoms call your doctor right away or come back to the emergency department for reevaluation.  It was my pleasure to care for you today.   Silas S. Wong, MD  

## 2023-04-01 NOTE — ED Provider Notes (Addendum)
[facility] Provider Note    Event Date/Time   First MD Initiated Contact with Patient 04/01/23 [phone]     (approximate)   History   Covid sx   HPI  Kristina Welch is a 32 y.o. female   Past medical history of iron deficiency anemia, here with viral URI symptoms with family contacts COVID-positive, 3 days of symptoms.  Cough, congestion, myalgias.  Some headache.   Whooshing sound in the right ear occasional vertigo as well.    No GI or GU symptoms.  No fever.  No chest pain   External Medical Documents Reviewed: A note from her nurse practitioner dated 03/21/2023 for a whooshing sound in her right ear, discussed follow-up with PCP for audiology/ENT referral for further evaluation      Physical Exam   Triage Vital Signs: ED Triage Vitals  Encounter Vitals Group     BP 04/01/23 1552 (!) 127/93     Systolic BP Percentile --      Diastolic BP Percentile --      Pulse Rate 04/01/23 1552 84     Resp 04/01/23 1552 18     Temp 04/01/23 1552 98.5 F (36.9 C)     Temp src --      SpO2 04/01/23 1552 100 %     Weight 04/01/23 1553 214 lb 8.1 oz (97.3 kg)     Height 04/01/23 1553 5\' 3"  (1.6 m)     Head Circumference --      Peak Flow --      Pain Score 04/01/23 1553 4     Pain Loc --      Pain Education --      Exclude from Growth Chart --     Most recent vital signs: Vitals:   04/01/23 1552  BP: (!) 127/93  Pulse: 84  Resp: 18  Temp: 98.5 F (36.9 C)  SpO2: 100%    General: Awake, no distress.  CV:  Good peripheral perfusion.  Resp:  Normal effort.  Abd:  No distention.  Other:  Awake alert comfortable appearing in no acute distress nontoxic no respiratory distress.  Afebrile, no hypoxemia and vital signs within normal limits.  Lungs clear to auscultation bilaterally, oropharynx appears normal, bilateral TMs appear normal as well.   ED Results / Procedures / Treatments   Labs (all labs ordered are listed, but only abnormal  results are displayed) Labs Reviewed  SARS CORONAVIRUS 2 BY RT PCR     I ordered and reviewed the above labs they are notable for COVID test is negative.  PROCEDURES:  Critical Care performed: No  Procedures   MEDICATIONS ORDERED IN ED: Medications - No data to display   IMPRESSION / MDM / ASSESSMENT AND PLAN / ED COURSE  I reviewed the triage vital signs and the nursing notes.                                Patient's presentation is most consistent with acute presentation with potential threat to life or bodily function.  Differential diagnosis includes, but is not limited to, viral URI, bacterial pneumonia, sepsis   The patient is on the cardiac monitor to evaluate for evidence of arrhythmia and/or significant heart rate changes.  MDM:    Viral URI symptoms in this well-appearing patient with no focal lung sounds.  Doubt bacterial pneumonia.  Without chest pain or significant shortness of breath  I doubt ACS, PE or other cardiopulmonary emergency.  He has no vertigo related to fullness sensation and whooshing sound in the right ear only occasionally, no other focal neurologic complaints I doubt stroke.  She should follow-up as previously advised with primary doctor or ENT/audiology as needed.  Meclizine for vertigo likely related to congestion in the setting of her viral URI symptoms, labyrinthitis considered as well.    Anticipatory guidance given, follow-up with PMD.       FINAL CLINICAL IMPRESSION(S) / ED DIAGNOSES   Final diagnoses:  Viral URI with cough  Vertigo     Rx / DC Orders   ED Discharge Orders          Ordered    meclizine (ANTIVERT) 25 MG tablet  3 times daily PRN        04/01/23 1742             Note:  This document was prepared using Dragon voice recognition software and may include unintentional dictation errors.    Pilar Jarvis, MD 04/01/23 1610    Pilar Jarvis, MD 04/01/23 [phone]

## 2023-04-01 NOTE — ED Triage Notes (Signed)
Pt to ED for congestion, headache, ear pain since Sunday. Was around family with covid

## 2023-04-24 ENCOUNTER — Encounter: Payer: Self-pay | Admitting: Emergency Medicine

## 2023-04-24 ENCOUNTER — Ambulatory Visit
Admission: EM | Admit: 2023-04-24 | Discharge: 2023-04-24 | Disposition: A | Discharge status: Home / Self Care | Payer: 59 | Attending: Emergency Medicine | Admitting: Emergency Medicine

## 2023-04-24 DIAGNOSIS — Z1152 Encounter for screening for COVID-19: Secondary | ICD-10-CM | POA: Insufficient documentation

## 2023-04-24 DIAGNOSIS — K529 Noninfective gastroenteritis and colitis, unspecified: Secondary | ICD-10-CM | POA: Diagnosis present

## 2023-04-24 LAB — RAPID INFLUENZA A&B ANTIGENS
Influenza A (ARMC): NEGATIVE
Influenza B (ARMC): NEGATIVE

## 2023-04-24 LAB — SARS CORONAVIRUS 2 BY RT PCR: SARS Coronavirus 2 by RT PCR: NEGATIVE

## 2023-04-24 MED ORDER — DICYCLOMINE HCL 20 MG PO TABS: 20.0000 mg | ORAL_TABLET | Freq: Two times a day (BID) | ORAL | 0 refills | Status: AC

## 2023-04-24 MED ORDER — ONDANSETRON 8 MG PO TBDP: 8.0000 mg | ORAL_TABLET | Freq: Three times a day (TID) | ORAL | 0 refills | Status: AC | PRN

## 2023-04-24 NOTE — ED Triage Notes (Signed)
Pt c/o vomiting, diarrhea, temperature of 101, body aches, body chills x2days

## 2023-04-24 NOTE — Discharge Instructions (Addendum)
Take the Zofran every 8 hours as needed for nausea and vomiting.  They are an oral disintegrating tablet and you can place them on her under your tongue and then will be absorbed.  Use the Bentyl (dicyclomine) every 6 hours as needed for abdominal cramping.  Follow a clear liquid diet for the next 6 to 12 hours.  Clear liquids consist of broth, ginger ale, water, Pedialyte, and Jell-O.  After 6 to 12 hours, if you are tolerating clear liquids, you can advance to bland foods such as bananas, rice, applesauce, and toast.  If you tolerate bland foods you can continue to advance your diet as you see fit.  If you develop a fever over 100.5, increased abdominal pain, bloody vomit, or bloody stool return for reevaluation or go to the ER.  

## 2023-04-24 NOTE — ED Provider Notes (Signed)
[facility]    CSN: [id number] Arrival date & time: 04/24/23  3016      History   Chief Complaint Chief Complaint  Patient presents with   Diarrhea   Vomiting    HPI Kristina Welch is a 32 y.o. female.   HPI  32 year old female with a past medical history significant for hidradenitis and iron deficiency anemia presents for evaluation of fever with a Tmax of 101 that began today, body aches, chills, vomiting, and diarrhea.  She has had approximately 4 episodes of vomiting and 3 episodes of diarrhea.  No blood in her stool.  She does have mild epigastric pain that improves when she vomits.  She denies any URI or lower respiratory symptoms.  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  Past Medical History:  Diagnosis Date   History of iron deficiency anemia    Hydradenitis     Patient Active Problem List   Diagnosis Date Noted   Urticaria 06/05/2016   DUB (dysfunctional uterine bleeding) 08/29/2015   H/O iron deficiency anemia 08/29/2015   Hidradenitis 08/29/2015    Past Surgical History:  Procedure Laterality Date   CESAREAN SECTION     cyst removed     TONSILLECTOMY      OB History     Gravida  3   Para  2   Term  0   Preterm  0   AB  0   Living         SAB  0   IAB  0   Ectopic  0   Multiple      Live Births               Home Medications    Prior to Admission medications   Medication Sig Start Date End Date Taking? Authorizing Provider  dicyclomine (BENTYL) 20 MG tablet Take 1 tablet (20 mg total) by mouth 2 (two) times daily. 04/24/23  Yes Becky Augusta, NP  FEROSUL 325 (65 Fe) MG tablet Take 325 mg by mouth daily. 04/17/23  Yes [provider]  ondansetron (ZOFRAN-ODT) 8 MG disintegrating tablet Take 1 tablet (8 mg total) by mouth every 8 (eight) hours as needed for nausea or vomiting. 04/24/23  Yes Becky Augusta, NP   albuterol (VENTOLIN HFA) 108 (90 Base) MCG/ACT inhaler Inhale 1-2 puffs into the lungs every 6 (six) hours as needed. 08/02/22   Mickie Bail, NP  fluticasone (FLONASE) 50 MCG/ACT nasal spray Place 1 spray into both nostrils daily. 03/12/23   Merrilee Jansky, MD  meclizine (ANTIVERT) 25 MG tablet Take 1 tablet (25 mg total) by mouth 3 (three) times daily as needed for dizziness. 04/01/23   Pilar Jarvis, MD    Family History Family History  Problem Relation Age of Onset   Cancer Mother        remission   Hypertension Mother    Hypertension Father    Healthy Daughter    Healthy Son    Cancer Maternal Grandmother        breast    Social History Social History   Tobacco Use   Smoking status: Never   Smokeless tobacco: Never  Vaping Use   Vaping status: Never Used  Substance Use Topics   Alcohol use: No   Drug use: No     Allergies   Peanut-containing drug products, Augmentin [amoxicillin-pot clavulanate], Peanut (diagnostic), Zyrtec [cetirizine],  Amoxicillin-pot clavulanate, and Cetirizine   Review of Systems Review of Systems  Constitutional:  Positive for chills and fever.  HENT:  Negative for congestion, ear pain, rhinorrhea and sore throat.   Respiratory:  Negative for cough, shortness of breath and wheezing.   Gastrointestinal:  Positive for abdominal pain, diarrhea, nausea and vomiting. Negative for blood in stool.  Musculoskeletal:  Positive for arthralgias and myalgias.     Physical Exam Triage Vital Signs ED Triage Vitals  Encounter Vitals Group     BP      Systolic BP Percentile      Diastolic BP Percentile      Pulse      Resp      Temp      Temp src      SpO2      Weight      Height      Head Circumference      Peak Flow      Pain Score      Pain Loc      Pain Education      Exclude from Growth Chart    No data found.  Updated Vital Signs BP 120/82 (BP Location: Left Arm)   Pulse 89   Temp 100.2 F (37.9 C) (Oral)   Ht 5\' 3"  (1.6 m)    Wt 212 lb 11.9 oz (96.5 kg)   LMP 04/13/2023   SpO2 97%   BMI 37.69 kg/m   Visual Acuity Right Eye Distance:   Left Eye Distance:   Bilateral Distance:    Right Eye Near:   Left Eye Near:    Bilateral Near:     Physical Exam Vitals and nursing note reviewed.  Constitutional:      Appearance: Normal appearance. She is not ill-appearing.  HENT:     Head: Normocephalic and atraumatic.  Cardiovascular:     Rate and Rhythm: Normal rate and regular rhythm.     Pulses: Normal pulses.     Heart sounds: Normal heart sounds. No murmur heard.    No friction rub. No gallop.  Pulmonary:     Effort: Pulmonary effort is normal.     Breath sounds: Normal breath sounds. No wheezing, rhonchi or rales.  Abdominal:     General: Abdomen is flat.     Palpations: Abdomen is soft.     Tenderness: There is abdominal tenderness. There is no guarding or rebound.     Comments: Mild epigastric tenderness without guarding or rebound.  Skin:    General: Skin is warm and dry.     Capillary Refill: Capillary refill takes less than 2 seconds.     Findings: No rash.  Neurological:     General: No focal deficit present.     Mental Status: She is alert and oriented to person, place, and time.      UC Treatments / Results  Labs (all labs ordered are listed, but only abnormal results are displayed) Labs Reviewed  SARS CORONAVIRUS 2 BY RT PCR  RAPID INFLUENZA A&B ANTIGENS    EKG   Radiology No results found.  Procedures Procedures (including critical care time)  Medications Ordered in UC Medications - No data to display  Initial Impression / Assessment and Plan / UC Course  I have reviewed the triage vital signs and the nursing notes.  Pertinent labs & imaging results that were available during my care of the patient were reviewed by me and considered in my medical decision making (see  chart for details).   Patient is a pleasant, nontoxic-appearing 32 year old female presenting for  evaluation of 2 days worth of GI symptoms with associated fever.  No associated upper or lower respiratory symptoms and no blood in the stool.  On exam she is well-hydrated and in no acute distress.  She denies any nausea at present.  She is currently febrile with a oral temp of 100.2.  Abdomen is soft, flat, with mild epigastric tenderness but no guarding or rebound.  There is also some mild tenderness in the left lower quadrant but the remainder of the abdomen is benign.  Differential diagnosis include viral gastroenteritis, influenza, and COVID.  I will order a flu antigen test and COVID PCR.  Influenza antigen test is negative for influenza A and B.  COVID PCR is negative.  I will discharge patient home with a diagnosis of viral gastroenteritis with prescription for Zofran for the nausea and Bentyl for any abdominal cramping.  Should she continue to run fevers, develop sharp, constant abdominal pain, or nausea and vomiting where she cannot keep down fluids she should report to the ER for evaluation.  Final Clinical Impressions(s) / UC Diagnoses   Final diagnoses:  Gastroenteritis     Discharge Instructions      Take the Zofran every 8 hours as needed for nausea and vomiting.  They are an oral disintegrating tablet and you can place them on her under your tongue and then will be absorbed.  Use the Bentyl (dicyclomine) every 6 hours as needed for abdominal cramping.  Follow a clear liquid diet for the next 6 to 12 hours.  Clear liquids consist of broth, ginger ale, water, Pedialyte, and Jell-O.  After 6 to 12 hours, if you are tolerating clear liquids, you can advance to bland foods such as bananas, rice, applesauce, and toast.  If you tolerate bland foods you can continue to advance your diet as you see fit.  If you develop a fever over 100.5, increased abdominal pain, bloody vomit, or bloody stool return for reevaluation or go to the ER.      ED Prescriptions     Medication Sig  Dispense Auth. Provider   dicyclomine (BENTYL) 20 MG tablet Take 1 tablet (20 mg total) by mouth 2 (two) times daily. 20 tablet Becky Augusta, NP   ondansetron (ZOFRAN-ODT) 8 MG disintegrating tablet Take 1 tablet (8 mg total) by mouth every 8 (eight) hours as needed for nausea or vomiting. 20 tablet Becky Augusta, NP      PDMP not reviewed this encounter.   Becky Augusta, NP 04/24/23 [phone]

## 2024-03-17 ENCOUNTER — Ambulatory Visit
Admission: EM | Admit: 2024-03-17 | Discharge: 2024-03-17 | Disposition: A | Discharge status: Home / Self Care | Attending: Family Medicine | Admitting: Family Medicine

## 2024-03-17 DIAGNOSIS — J069 Acute upper respiratory infection, unspecified: Secondary | ICD-10-CM | POA: Insufficient documentation

## 2024-03-17 LAB — SARS CORONAVIRUS 2 BY RT PCR: SARS Coronavirus 2 by RT PCR: NEGATIVE

## 2024-03-17 MED ORDER — IPRATROPIUM BROMIDE 0.06 % NA SOLN: 2.0000 | Freq: Four times a day (QID) | NASAL | 12 refills | Status: AC

## 2024-03-17 NOTE — Discharge Instructions (Signed)
 Your COVID test today was negative but your exam is consistent with a viral upper respiratory tract infection.  Your stopping sugar cold [place] may also be contributing to your headache.  Use over-the-counter Tylenol  and or ibuprofen  according to the package instructions as needed for any pain.  Use the Atrovent  nasal spray, 2 squirts up each nostril every 6 hours, as needed for runny nose and nasal congestion.  If you develop any cough please use over-the-counter cough preparations such as Delsym, Robitussin, or Zarbee's.  If you develop any new or worsening symptoms please return for reevaluation or see your primary care provider.

## 2024-03-17 NOTE — ED Provider Notes (Signed)
 [facility]    CSN: [id number] Arrival date & time: 03/17/24  1859      History   Chief Complaint No chief complaint on file.   HPI Kristina Welch is a 33 y.o. female.   HPI  33 year old female with past medical history significant for iron deficiency anemia, hidradenitis suppurativa, dysfunctional uterine bleeding presents for evaluation of left frontal headache and fatigue that has been going on for the last 3 days.  She is not sure if the headache is due to the sinus pain or because she stopped sugar cold [place] 3 days ago.  She does endorse runny nose and nasal congestion but denies any fever, sore throat, cough, or bodyaches.  Past Medical History:  Diagnosis Date   History of iron deficiency anemia    Hydradenitis     Patient Active Problem List   Diagnosis Date Noted   Urticaria 06/05/2016   DUB (dysfunctional uterine bleeding) 08/29/2015   H/O iron deficiency anemia 08/29/2015   Hidradenitis 08/29/2015    Past Surgical History:  Procedure Laterality Date   CESAREAN SECTION     cyst removed     TONSILLECTOMY      OB History     Gravida  3   Para  2   Term  0   Preterm  0   AB  0   Living         SAB  0   IAB  0   Ectopic  0   Multiple      Live Births               Home Medications    Prior to Admission medications   Medication Sig Start Date End Date Taking? Authorizing Provider  ipratropium (ATROVENT ) 0.06 % nasal spray Place 2 sprays into both nostrils 4 (four) times daily. 03/17/24  Yes Bernardino Ditch, NP  albuterol  (VENTOLIN  HFA) 108 (90 Base) MCG/ACT inhaler Inhale 1-2 puffs into the lungs every 6 (six) hours as needed. 08/02/22   Corlis Burnard DEL, NP  dicyclomine  (BENTYL ) 20 MG tablet Take 1 tablet (20 mg total) by mouth 2 (two) times daily. 04/24/23   Bernardino Ditch, NP  FEROSUL 325 (65 Fe) MG tablet Take 325 mg by mouth daily. 04/17/23   [provider]  fluticasone  (FLONASE ) 50 MCG/ACT nasal spray  Place 1 spray into both nostrils daily. 03/12/23   Blaise Aleene KIDD, MD  meclizine  (ANTIVERT ) 25 MG tablet Take 1 tablet (25 mg total) by mouth 3 (three) times daily as needed for dizziness. 04/01/23   Cyrena Mylar, MD  ondansetron  (ZOFRAN -ODT) 8 MG disintegrating tablet Take 1 tablet (8 mg total) by mouth every 8 (eight) hours as needed for nausea or vomiting. 04/24/23   Bernardino Ditch, NP    Family History Family History  Problem Relation Age of Onset   Cancer Mother        remission   Hypertension Mother    Hypertension Father    Healthy Daughter    Healthy Son    Cancer Maternal Grandmother        breast    Social History Social History   Tobacco Use   Smoking status: Never   Smokeless tobacco: Never  Vaping Use   Vaping status: Never Used  Substance Use Topics   Alcohol use: No   Drug use: No     Allergies   Peanut-containing drug products, Augmentin  [amoxicillin -pot clavulanate], Peanut (diagnostic), Zyrtec [cetirizine], Amoxicillin -pot clavulanate, and Cetirizine  Review of Systems Review of Systems  Constitutional:  Negative for fever.  HENT:  Positive for congestion, rhinorrhea and sinus pressure. Negative for ear pain.   Respiratory:  Negative for cough.   Musculoskeletal:  Negative for arthralgias and myalgias.  Neurological:  Positive for headaches.     Physical Exam Triage Vital Signs ED Triage Vitals  Encounter Vitals Group     BP      Girls Systolic BP Percentile      Girls Diastolic BP Percentile      Boys Systolic BP Percentile      Boys Diastolic BP Percentile      Pulse      Resp      Temp      Temp src      SpO2      Weight      Height      Head Circumference      Peak Flow      Pain Score      Pain Loc      Pain Education      Exclude from Growth Chart    No data found.  Updated Vital Signs BP 127/87 (BP Location: Left Arm)   Pulse 72   Temp 98.6 F (37 C) (Oral)   Ht 5' 1 (1.549 m)   Wt 212 lb 12.8 oz (96.5 kg)   LMP  03/12/2024   SpO2 98%   BMI 40.21 kg/m   Visual Acuity Right Eye Distance:   Left Eye Distance:   Bilateral Distance:    Right Eye Near:   Left Eye Near:    Bilateral Near:     Physical Exam Vitals and nursing note reviewed.  Constitutional:      Appearance: Normal appearance. She is not ill-appearing.  HENT:     Head: Normocephalic and atraumatic.     Right Ear: Tympanic membrane, ear canal and external ear normal. There is no impacted cerumen.     Left Ear: Tympanic membrane, ear canal and external ear normal. There is no impacted cerumen.     Nose: Congestion and rhinorrhea present.     Comments: Nasal mucosa is edematous and erythematous with clear discharge in both nares.  Bilateral frontal and maxillary sinuses are nontender to compression.    Mouth/Throat:     Mouth: Mucous membranes are moist.     Pharynx: Oropharynx is clear. No oropharyngeal exudate or posterior oropharyngeal erythema.  Eyes:     Extraocular Movements: Extraocular movements intact.     Conjunctiva/sclera: Conjunctivae normal.     Pupils: Pupils are equal, round, and reactive to light.  Cardiovascular:     Rate and Rhythm: Normal rate and regular rhythm.     Pulses: Normal pulses.     Heart sounds: Normal heart sounds. No murmur heard.    No friction rub. No gallop.  Pulmonary:     Effort: Pulmonary effort is normal.     Breath sounds: Normal breath sounds. No wheezing, rhonchi or rales.  Musculoskeletal:     Cervical back: Normal range of motion and neck supple.  Lymphadenopathy:     Cervical: No cervical adenopathy.  Skin:    General: Skin is warm and dry.     Capillary Refill: Capillary refill takes less than 2 seconds.     Findings: No rash.  Neurological:     General: No focal deficit present.     Mental Status: She is alert and oriented to person, place, and time.  UC Treatments / Results  Labs (all labs ordered are listed, but only abnormal results are displayed) Labs  Reviewed  SARS CORONAVIRUS 2 BY RT PCR    EKG   Radiology No results found.  Procedures Procedures (including critical care time)  Medications Ordered in UC Medications - No data to display  Initial Impression / Assessment and Plan / UC Course  I have reviewed the triage vital signs and the nursing notes.  Pertinent labs & imaging results that were available during my care of the patient were reviewed by me and considered in my medical decision making (see chart for details).   Patient is a pleasant, nontoxic-appearing 33 year old female presenting for evaluation of headache and fatigue as outlined in HPI above.  She has had some sinus pressure as well as runny nose and nasal congestion but no other respiratory symptoms or fever.  She is unsure if her headache is due to these respiratory symptoms or because she quit sugar cold [place] 3 days ago.  Her physical exam does reveal inflammation of her upper respiratory tract as evidenced by inflamed nasal mucosa with clear rhinorrhea.  Oropharyngeal exam is benign.  No cervical lymphadenopathy present.  Cardiopulmonary exam reveals: Sounds in all fields.  Differential diagnose include COVID, influenza, sugar withdrawal, or viral respiratory illness.  I will order a COVID and flu PCR.  COVID PCR is negative.  I will discharge patient with a diagnosis of viral URI with prescription for Atrovent  nasal spray to help with nasal congestion.  She can use over-the-counter Tylenol  and/or ibuprofen  as needed for headache pain.   Final Clinical Impressions(s) / UC Diagnoses   Final diagnoses:  Viral URI     Discharge Instructions      Your COVID test today was negative but your exam is consistent with a viral upper respiratory tract infection.  Your stopping sugar cold [place] may also be contributing to your headache.  Use over-the-counter Tylenol  and or ibuprofen  according to the package instructions as needed for any pain.  Use the  Atrovent  nasal spray, 2 squirts up each nostril every 6 hours, as needed for runny nose and nasal congestion.  If you develop any cough please use over-the-counter cough preparations such as Delsym, Robitussin, or Zarbee's.  If you develop any new or worsening symptoms please return for reevaluation or see your primary care provider.     ED Prescriptions     Medication Sig Dispense Auth. Provider   ipratropium (ATROVENT ) 0.06 % nasal spray Place 2 sprays into both nostrils 4 (four) times daily. 15 mL Bernardino Ditch, NP      PDMP not reviewed this encounter.   Bernardino Ditch, NP 03/17/24 2027

## 2024-03-17 NOTE — ED Triage Notes (Signed)
 Pt c/o fatigue and headache x3days  Pt states that she has a history of low iron and vitamin D   Pt is unsure if the headache is due to sinus pain or due to her stopping sugar cold [place] 3 days ago  Pt states that she had her menstrual 2 times this month

## 2024-03-28 ENCOUNTER — Ambulatory Visit
Admission: EM | Admit: 2024-03-28 | Discharge: 2024-03-28 | Disposition: A | Discharge status: Home / Self Care | Attending: Physician Assistant | Admitting: Physician Assistant

## 2024-03-28 DIAGNOSIS — J019 Acute sinusitis, unspecified: Secondary | ICD-10-CM | POA: Insufficient documentation

## 2024-03-28 DIAGNOSIS — R519 Headache, unspecified: Secondary | ICD-10-CM | POA: Insufficient documentation

## 2024-03-28 DIAGNOSIS — R051 Acute cough: Secondary | ICD-10-CM | POA: Diagnosis present

## 2024-03-28 LAB — SARS CORONAVIRUS 2 BY RT PCR: SARS Coronavirus 2 by RT PCR: NEGATIVE

## 2024-03-28 MED ORDER — DOXYCYCLINE HYCLATE 100 MG PO CAPS: 100.0000 mg | ORAL_CAPSULE | Freq: Two times a day (BID) | ORAL | 0 refills | Status: AC

## 2024-03-28 NOTE — Discharge Instructions (Addendum)
-  Take Claritin-D and start using Flonase  every day.  Ibuprofen  and/or Tylenol  as needed for headache. - Start antibiotics - If your headache worsens go to the ER.

## 2024-03-28 NOTE — ED Provider Notes (Signed)
 [facility]    CSN: [id number] Arrival date & time: 03/28/24  9094      History   Chief Complaint Chief Complaint  Patient presents with   Cough    HPI Karis Rilling Gilmer is a 33 y.o. female presenting for 2-day history of right sided headache and facial pain, runny nose, cough, fatigue.  Denies fever, sore throat, chest pain or shortness of breath.  States she was sick a little over a week and a half ago with cold-like symptoms which resolved.  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  Has not taken any OTC meds today.  HPI  Past Medical History:  Diagnosis Date   History of iron deficiency anemia    Hydradenitis     Patient Active Problem List   Diagnosis Date Noted   Urticaria 06/05/2016   DUB (dysfunctional uterine bleeding) 08/29/2015   H/O iron deficiency anemia 08/29/2015   Hidradenitis 08/29/2015    Past Surgical History:  Procedure Laterality Date   CESAREAN SECTION     cyst removed     TONSILLECTOMY      OB History     Gravida  3   Para  2   Term  0   Preterm  0   AB  0   Living         SAB  0   IAB  0   Ectopic  0   Multiple      Live Births               Home Medications    Prior to Admission medications   Medication Sig Start Date End Date Taking? Authorizing Provider  doxycycline  (VIBRAMYCIN ) 100 MG capsule Take 1 capsule (100 mg total) by mouth 2 (two) times daily for 7 days. 03/28/24 04/04/24 Yes Arvis Jolan NOVAK, PA-C  albuterol  (VENTOLIN  HFA) 108 (90 Base) MCG/ACT inhaler Inhale 1-2 puffs into the lungs every 6 (six) hours as needed. 08/02/22   Corlis Burnard DEL, NP  dicyclomine  (BENTYL ) 20 MG tablet Take 1 tablet (20 mg total) by mouth 2 (two) times daily. 04/24/23   Bernardino Ditch, NP  FEROSUL 325 (65 Fe) MG tablet Take 325 mg by mouth daily. 04/17/23   [provider]  fluticasone  (FLONASE ) 50 MCG/ACT nasal spray Place 1 spray into both nostrils daily. 03/12/23   Blaise Aleene KIDD, MD  ipratropium (ATROVENT ) 0.06 % nasal spray Place 2 sprays into both nostrils 4 (four) times daily. 03/17/24   Bernardino Ditch, NP  meclizine  (ANTIVERT ) 25 MG tablet Take 1 tablet (25 mg total) by mouth 3 (three) times daily as needed for dizziness. 04/01/23   Cyrena Mylar, MD  ondansetron  (ZOFRAN -ODT) 8 MG disintegrating tablet Take 1 tablet (8 mg total) by mouth every 8 (eight) hours as needed for nausea or vomiting. 04/24/23   Bernardino Ditch, NP    Family History Family History  Problem Relation Age of Onset   Cancer Mother        remission   Hypertension Mother    Hypertension Father    Healthy Daughter    Healthy Son    Cancer Maternal Grandmother        breast    Social History Social History   Tobacco Use   Smoking status: Never   Smokeless tobacco: Never  Vaping Use   Vaping status: Never Used  Substance Use Topics   Alcohol use: No   Drug use: No  Allergies   Peanut-containing drug products, Augmentin  [amoxicillin -pot clavulanate], Peanut (diagnostic), Zyrtec [cetirizine], Amoxicillin -pot clavulanate, and Cetirizine   Review of Systems Review of Systems  Constitutional:  Positive for fatigue. Negative for chills, diaphoresis and fever.  HENT:  Positive for congestion, rhinorrhea, sinus pressure and sinus pain. Negative for ear pain and sore throat.   Respiratory:  Positive for cough. Negative for shortness of breath.   Cardiovascular:  Negative for chest pain.  Gastrointestinal:  Negative for abdominal pain, nausea and vomiting.  Musculoskeletal:  Negative for arthralgias and myalgias.  Skin:  Negative for rash.  Neurological:  Positive for dizziness and headaches. Negative for weakness.  Hematological:  Negative for adenopathy.     Physical Exam Triage Vital Signs ED Triage Vitals  Encounter Vitals Group     BP 03/28/24 1019 118/82     Girls Systolic BP Percentile --      Girls Diastolic BP Percentile --      Boys Systolic BP Percentile --       Boys Diastolic BP Percentile --      Pulse Rate 03/28/24 1019 64     Resp 03/28/24 1019 18     Temp 03/28/24 1019 98.7 F (37.1 C)     Temp Source 03/28/24 1019 Oral     SpO2 03/28/24 1019 98 %     Weight 03/28/24 1018 206 lb 11.2 oz (93.8 kg)     Height --      Head Circumference --      Peak Flow --      Pain Score 03/28/24 1018 3     Pain Loc --      Pain Education --      Exclude from Growth Chart --    No data found.  Updated Vital Signs BP 118/82 (BP Location: Right Arm)   Pulse 64   Temp 98.7 F (37.1 C) (Oral)   Resp 18   Wt 206 lb 11.2 oz (93.8 kg)   LMP 03/12/2024   SpO2 98%   BMI 39.06 kg/m   Physical Exam Vitals and nursing note reviewed.  Constitutional:      General: She is not in acute distress.    Appearance: Normal appearance. She is not ill-appearing or toxic-appearing.  HENT:     Head: Normocephalic and atraumatic.     Right Ear: Tympanic membrane, ear canal and external ear normal.     Left Ear: Tympanic membrane, ear canal and external ear normal.     Nose: Congestion present.     Mouth/Throat:     Mouth: Mucous membranes are moist.     Pharynx: Oropharynx is clear.  Eyes:     General: No scleral icterus.       Right eye: No discharge.        Left eye: No discharge.     Conjunctiva/sclera: Conjunctivae normal.  Cardiovascular:     Rate and Rhythm: Normal rate and regular rhythm.     Heart sounds: Normal heart sounds.  Pulmonary:     Effort: Pulmonary effort is normal. No respiratory distress.     Breath sounds: Normal breath sounds.  Musculoskeletal:     Cervical back: Neck supple.  Skin:    General: Skin is dry.  Neurological:     General: No focal deficit present.     Mental Status: She is alert. Mental status is at baseline.     Motor: No weakness.     Gait: Gait normal.  Psychiatric:  Mood and Affect: Mood normal.        Behavior: Behavior normal.      UC Treatments / Results  Labs (all labs ordered are listed,  but only abnormal results are displayed) Labs Reviewed  SARS CORONAVIRUS 2 BY RT PCR    EKG   Radiology No results found.  Procedures Procedures (including critical care time)  Medications Ordered in UC Medications - No data to display  Initial Impression / Assessment and Plan / UC Course  I have reviewed the triage vital signs and the nursing notes.  Pertinent labs & imaging results that were available during my care of the patient were reviewed by me and considered in my medical decision making (see chart for details).   33 year old female presents for 2-day history of right sided facial pain and headache, runny nose, cough.  Patient sick 1-1/2 weeks ago with congestion and facial pressure which resolved until symptoms returned today.  Possible COVID exposure.  Works as a Engineer, site.  Vitals are stable and normal.  Overall well-appearing.  No acute distress.  On exam has nasal congestion.  No evidence of ear infection.  Throat clear.  Chest clear.  PCR COVID test obtained.  Negative.  Reviewed results patient.  Acute sinusitis.  Treating at this time with doxycycline .  Advised to take Claritin-D and use Flonase .  Rest and fluids.  Reviewed return precautions.  Work note given.   Final Clinical Impressions(s) / UC Diagnoses   Final diagnoses:  Acute sinusitis, recurrence not specified, unspecified location  Acute cough  Facial pain     Discharge Instructions         -Take Claritin-D and start using Flonase  every day.  Ibuprofen  and/or Tylenol  as needed for headache. - Start antibiotics - If your headache worsens go to the ER.     ED Prescriptions     Medication Sig Dispense Auth. Provider   doxycycline  (VIBRAMYCIN ) 100 MG capsule Take 1 capsule (100 mg total) by mouth 2 (two) times daily for 7 days. 14 capsule Arvis Jolan NOVAK, PA-C      PDMP not reviewed this encounter.   Arvis Jolan NOVAK, PA-C 03/28/24 1202

## 2024-03-28 NOTE — ED Triage Notes (Signed)
 Patient states that she's had her sx x 2 days  Cough  Headache Right side of face hurting Runny nose.

## 2024-03-30 ENCOUNTER — Other Ambulatory Visit: Payer: Self-pay | Admitting: Medical Genetics

## 2024-04-09 ENCOUNTER — Other Ambulatory Visit
Admission: RE | Admit: 2024-04-09 | Discharge: 2024-04-09 | Disposition: A | Discharge status: Home / Self Care | Payer: Self-pay | Source: Ambulatory Visit | Attending: Medical Genetics | Admitting: Medical Genetics

## 2024-04-17 LAB — GENECONNECT MOLECULAR SCREEN: Genetic Analysis Overall Interpretation: NEGATIVE

## 2024-05-06 IMAGING — US US PELVIS COMPLETE TRANSABD/TRANSVAG W DUPLEX AND/OR DOPPLER
1 series · 13 of 25 positions shown · non-contrast
Comparison: None Available.

CLINICAL DATA: Right pelvic pain.  No report of recent bleeding.

EXAM:
TRANSABDOMINAL AND TRANSVAGINAL ULTRASOUND OF PELVIS
DOPPLER ULTRASOUND OF OVARIES
TECHNIQUE: Both transabdominal and transvaginal ultrasound examinations of the
pelvis were performed. Transabdominal technique was performed for
global imaging of the pelvis including uterus, ovaries, adnexal
regions, and pelvic cul-de-sac.
It was necessary to proceed with endovaginal exam following the
transabdominal exam to visualize the ovaries and adnexal spaces as
well as the endometrium to a better advantage. Color and duplex
Doppler ultrasound was utilized to evaluate blood flow to the
ovaries.

[Series 1: us pelvic complete w transvaginal and torsion righ · 13 of 61 slices shown]
[im 1/61]
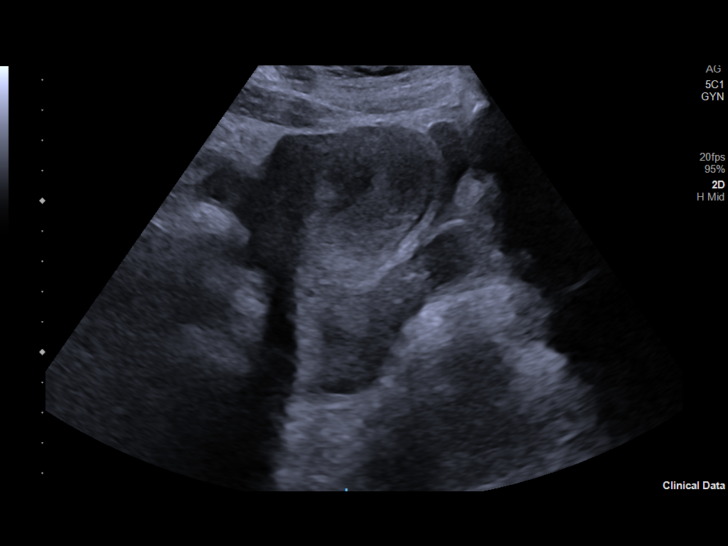
[im 6/61]
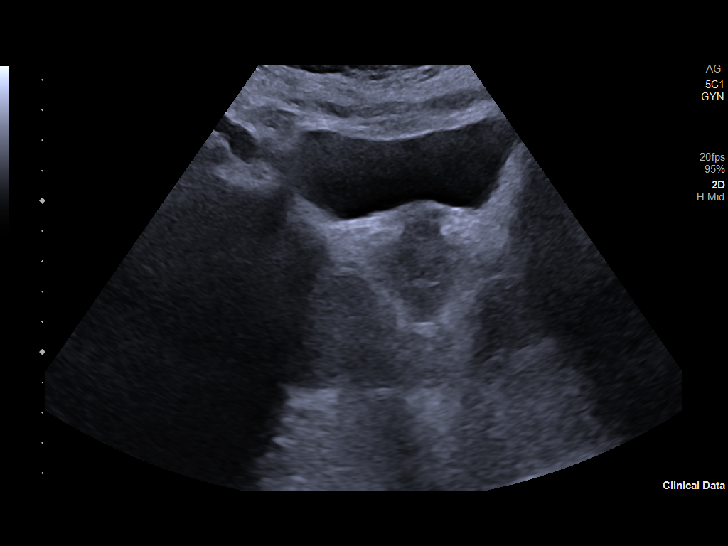
[im 11/61]
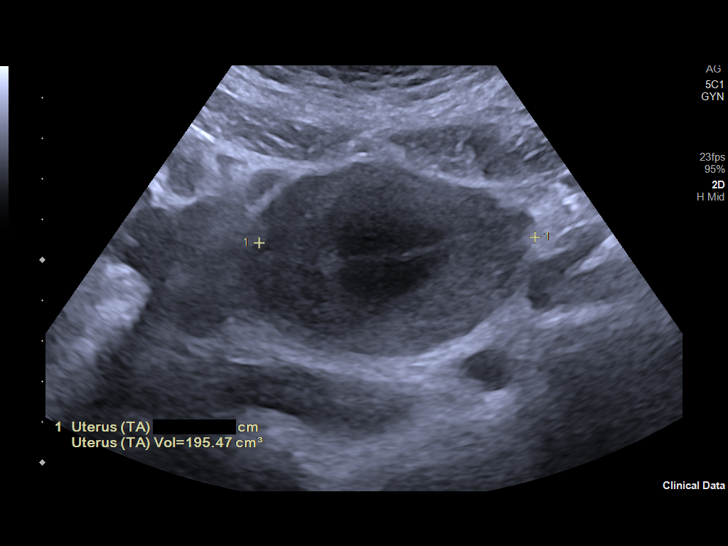
[im 16/61]
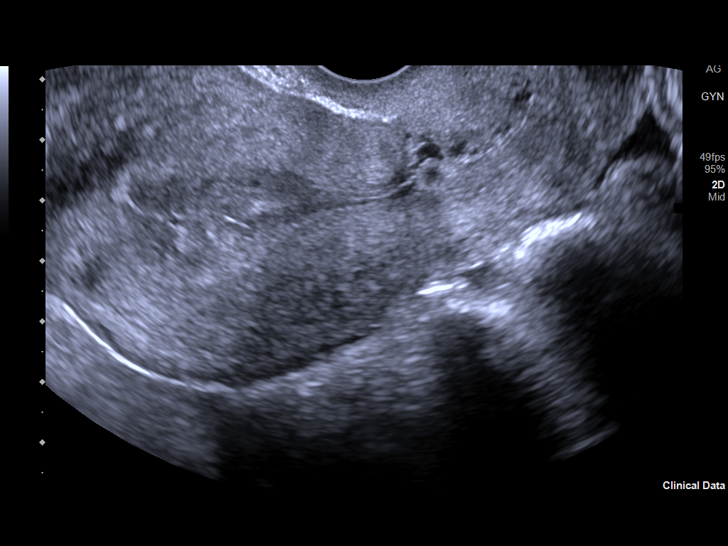
[im 21/61]
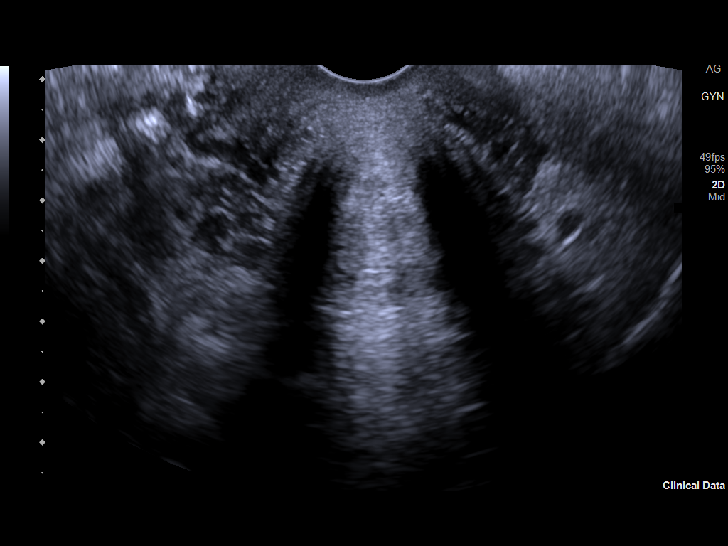
[im 26/61]
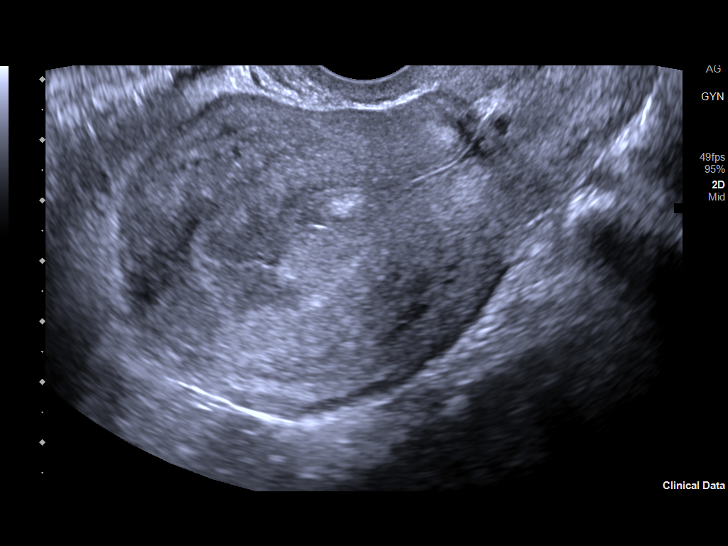
[im 31/61]
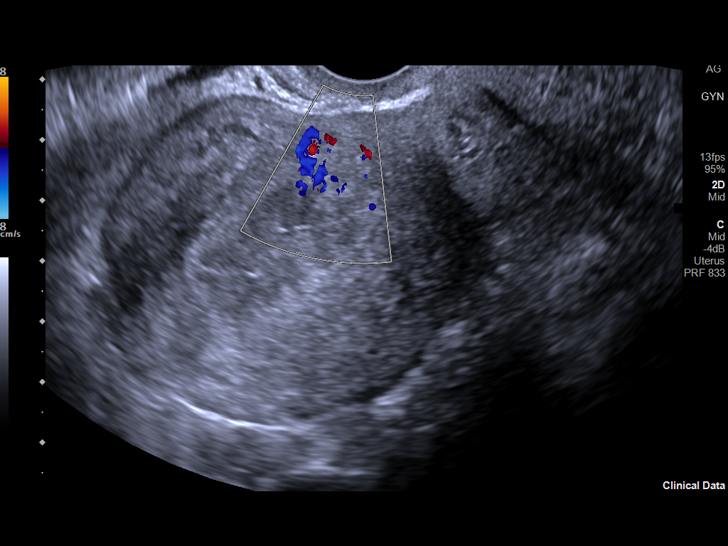
[im 36/61]
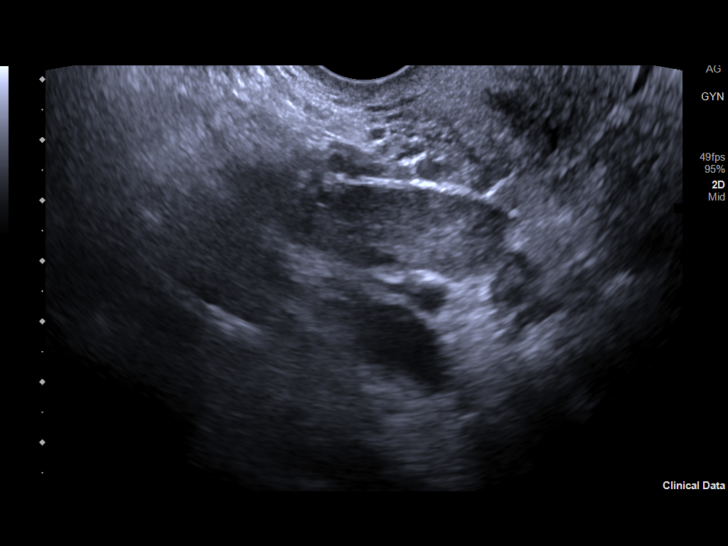
[im 41/61]
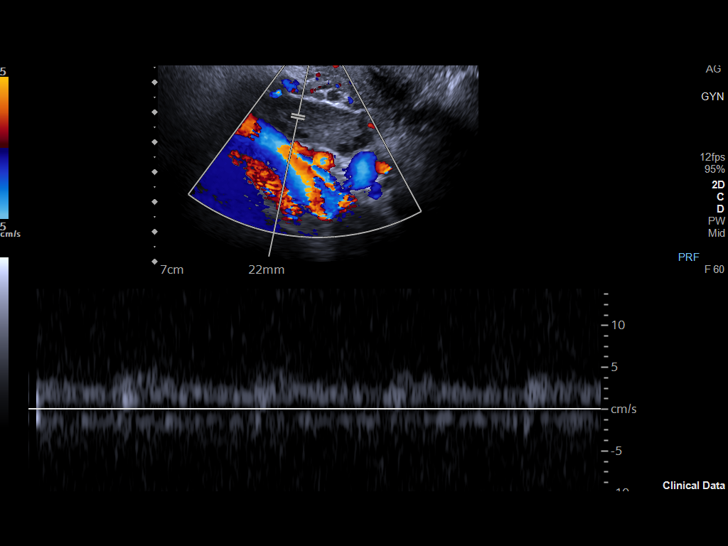
[im 46/61]
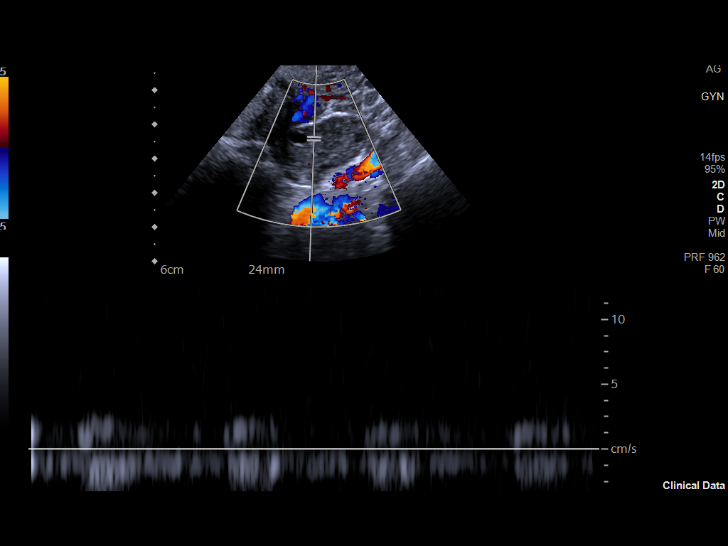
[im 51/61]
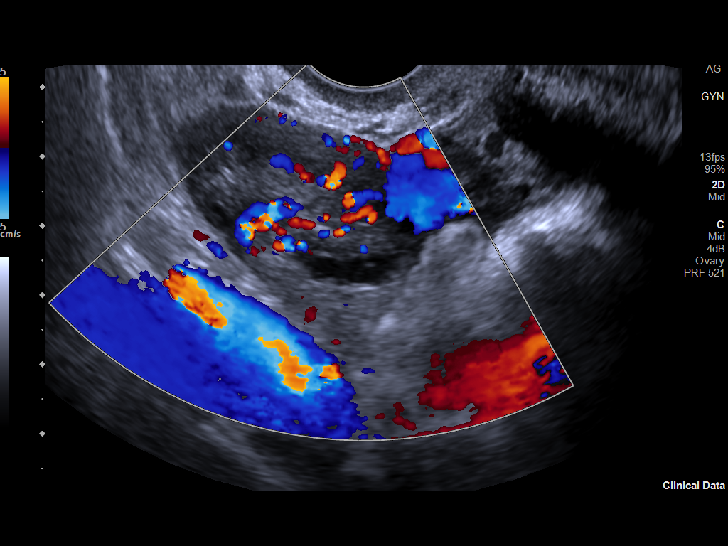
[im 56/61]
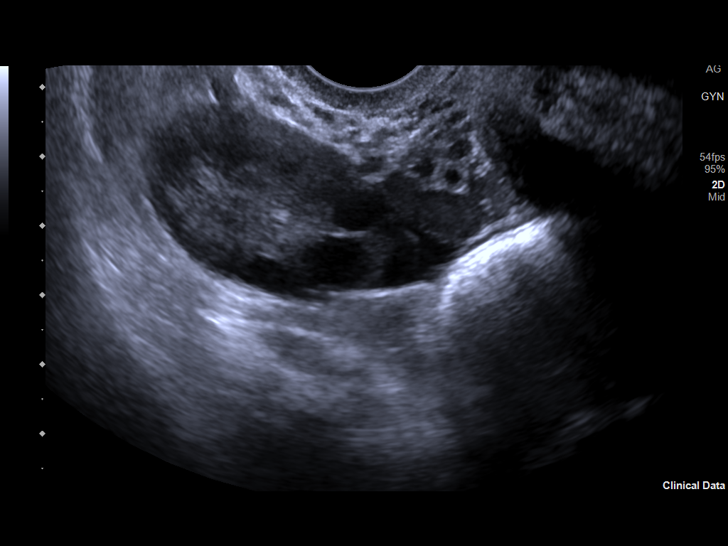
[im 61/61]
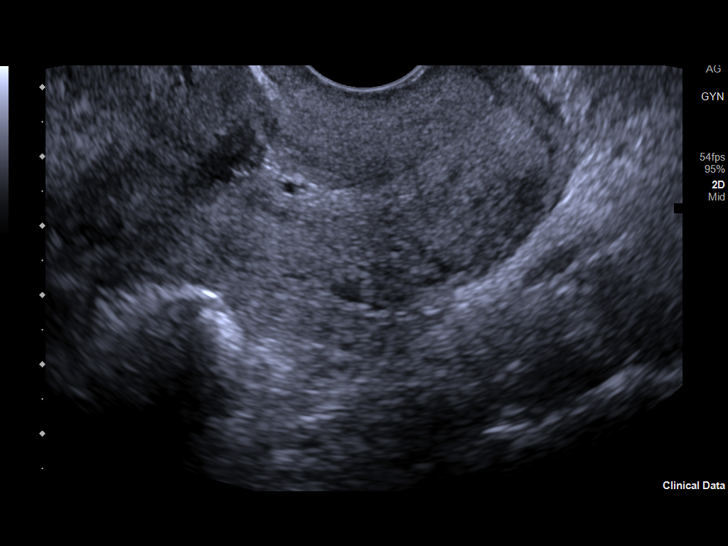

[13 of 25 positions shown; findings below may reference images not displayed]

FINDINGS: Uterus

Measurements: Anteverted measuring 10.6 x 5.4 x 6.6 cm = volume: 198
mL. No wall fibroids or other mass visualized. Cervix is
unremarkable and measures 3 cm length.

Endometrium

Thickness: Thickened and heterogeneous measuring 2.4 cm diameter. In
the ventral aspect of the proximal uterine cavity there is an
echogenic polypoid lesion with trace color flow measuring 6 x 8 x 8
mm. Dorsally in the mid uterine cavity there is another echogenic
polypoid structure with color flow measuring 9 x 11 x 9 mm. These
could represent endometrial polyps or submucosal fibroids.

Right ovary

Measurements: 3.6 x 2.2 x 3.0 cm = volume: 12.6 mL. Normal
appearance/no adnexal mass.

Left ovary

Measurements: 3.8 x 1.4 x 2.4 cm = volume: 7.0 mL. Normal
appearance/no adnexal mass.

Pulsed Doppler evaluation of both ovaries demonstrates normal
low-resistance arterial and venous waveforms.

Other findings

There is trace anechoic fluid in the pelvic cul-de-sac, nonspecific.
There is mild bladder thickening versus underdistention. No adnexal
mass is seen.
IMPRESSION: 1. There are 2 echogenic polypoid lesions in the uterine cavity
along the wall of the cavity, largest is 1.1 cm and both show color
flow. These could be due to submucosal fibroids or endometrial
polyps.
2. Thickened heterogeneous endometrial complex measuring 2.4 cm. Gyn
consult recommended.
3. Cystitis versus bladder nondistention.
4. Trace anechoic free fluid, nonspecific but typically physiologic
at this age.

## 2024-05-06 IMAGING — CT CT ABD-PELV W/O CM
2 of 4 series · 17 of 46 positions shown, 19 images · non-contrast
Comparison: Pelvic ultrasound 12/31/2021

CLINICAL DATA: Right-sided pelvic pain



[Series 2: routine abd/pel wo · axial · 0.98mm/px · z∈[-844,-409]mm · 14 of 95 slices shown, 16 images]
[im 4/95  soft-tissue]
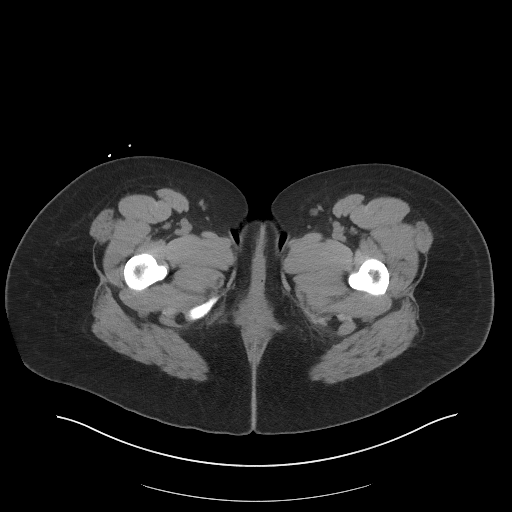
[im 4/95  bone]
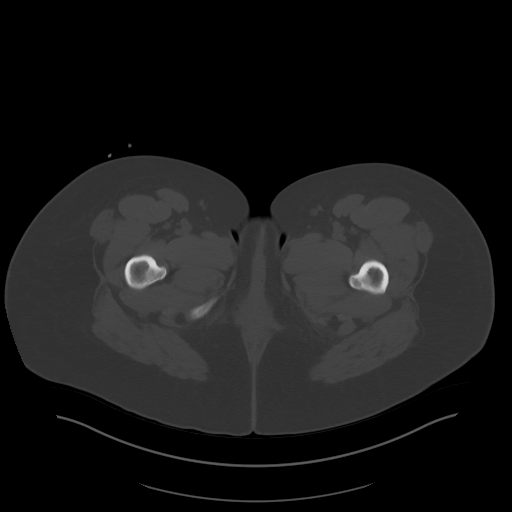
[im 11/95  soft-tissue]
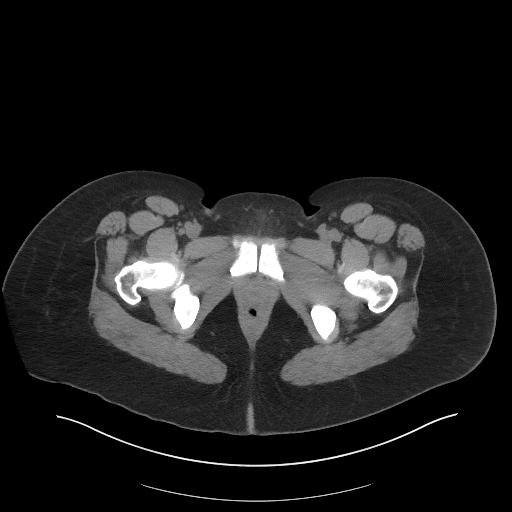
[im 19/95  soft-tissue]
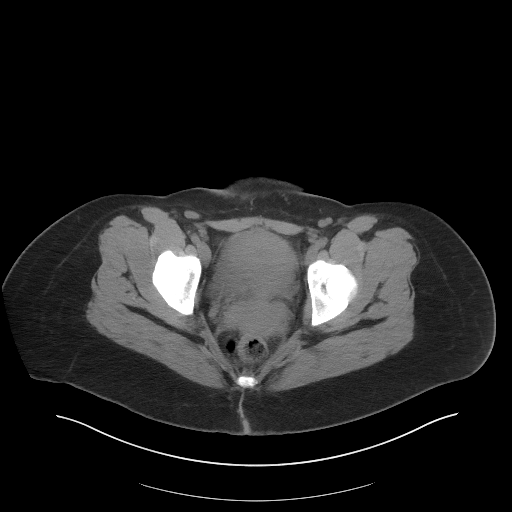
[im 26/95  soft-tissue]
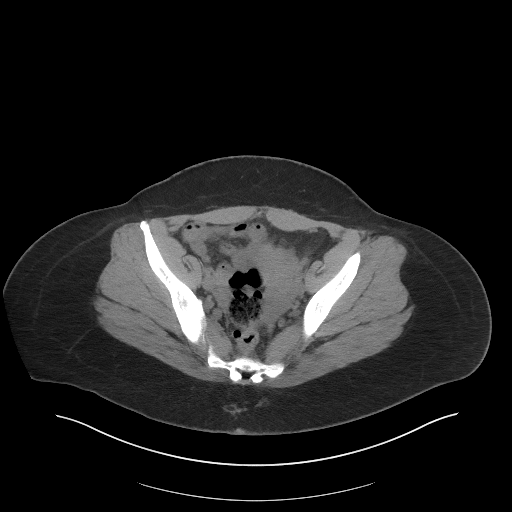
[im 33/95  soft-tissue]
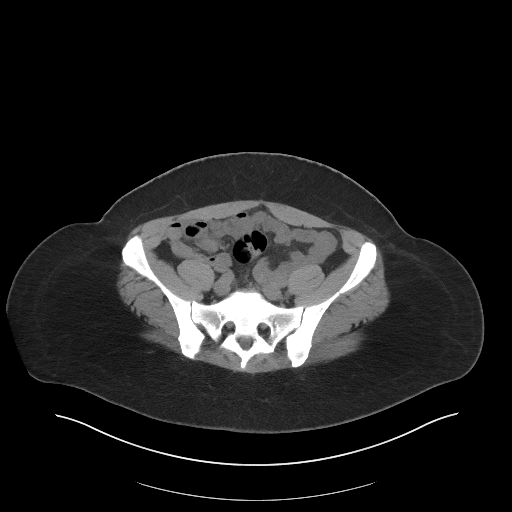
[im 37/95  soft-tissue]
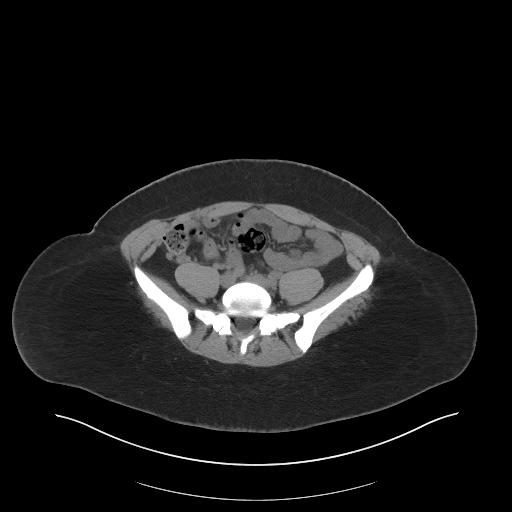
[im 44/95  soft-tissue]
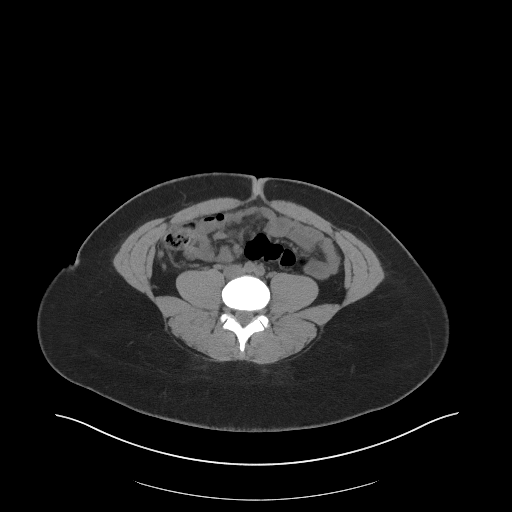
[im 51/95  soft-tissue]
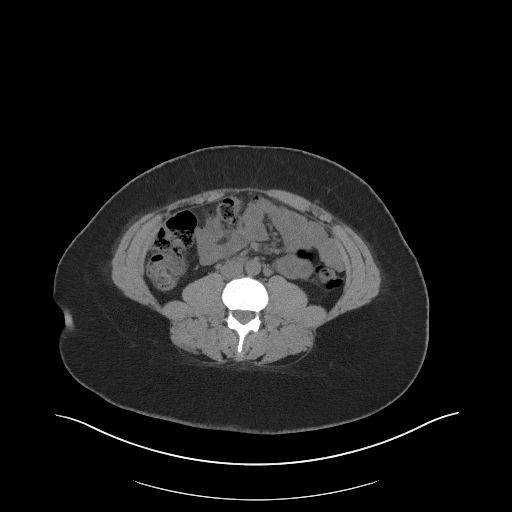
[im 58/95  soft-tissue]
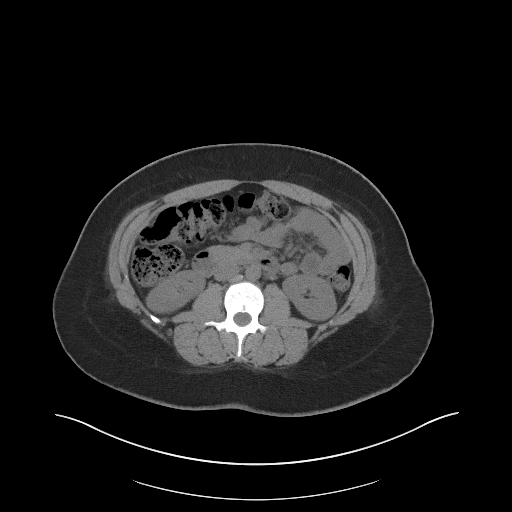
[im 58/95  bone]
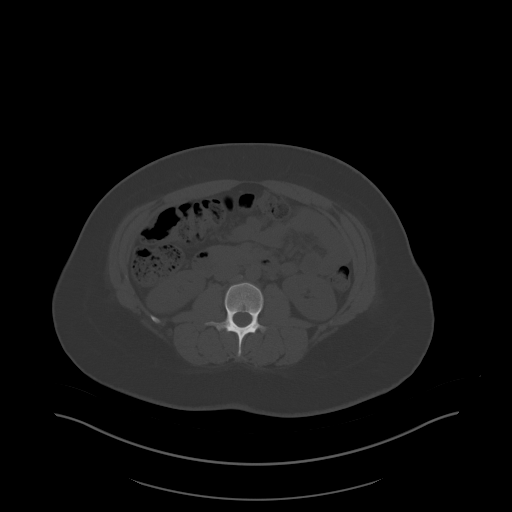
[im 62/95  soft-tissue]
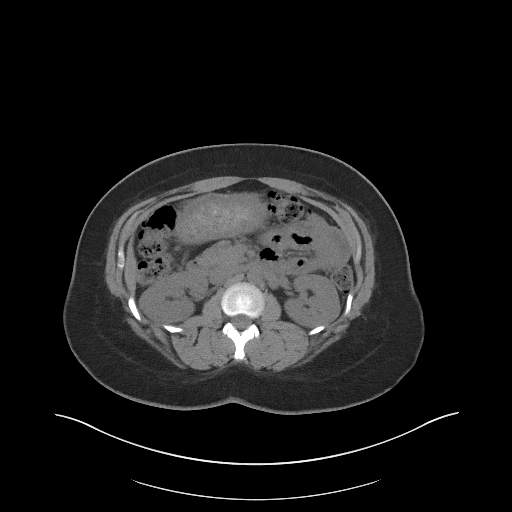
[im 69/95  soft-tissue]
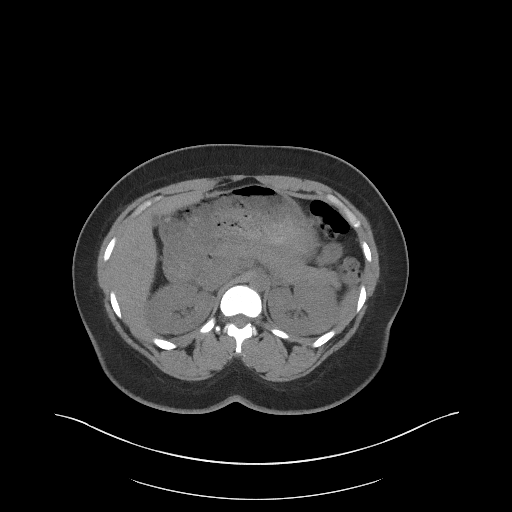
[im 76/95  soft-tissue]
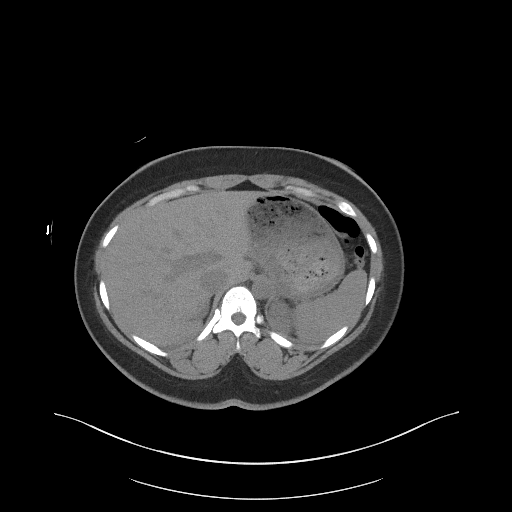
[im 84/95  soft-tissue]
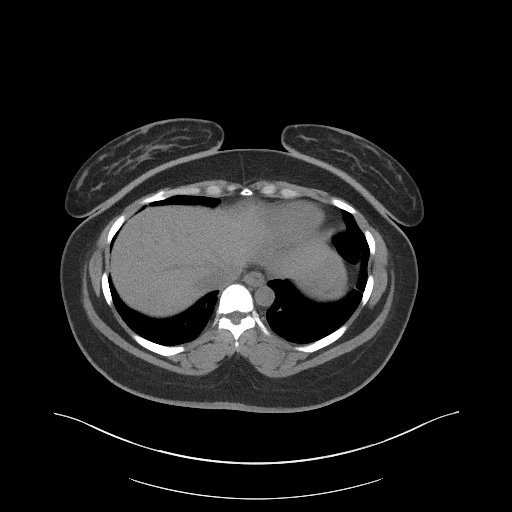
[im 91/95  soft-tissue]
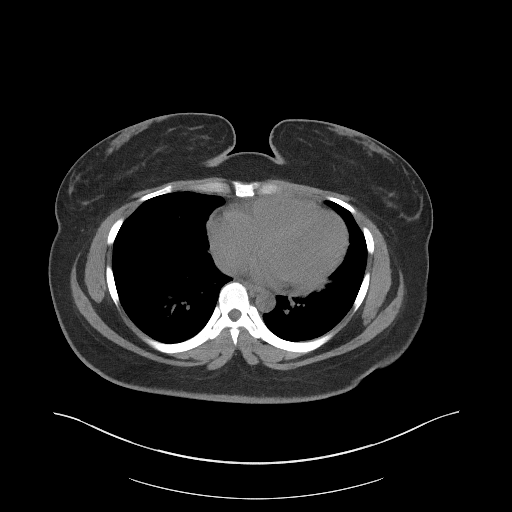

[Series 5: coronal st · coronal · 0.95mm/px · 3 of 90 slices shown]
[im 30/90  soft-tissue]
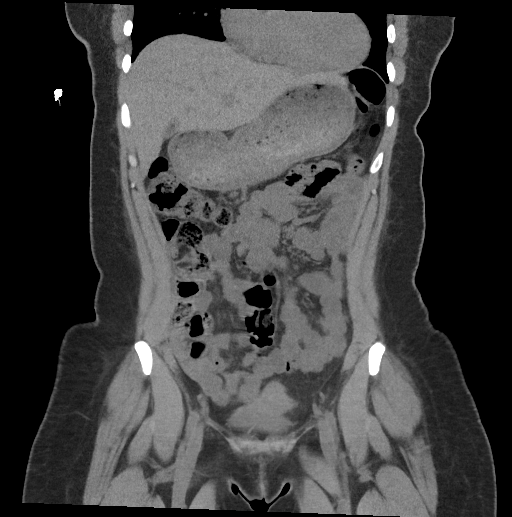
[im 40/90  soft-tissue]
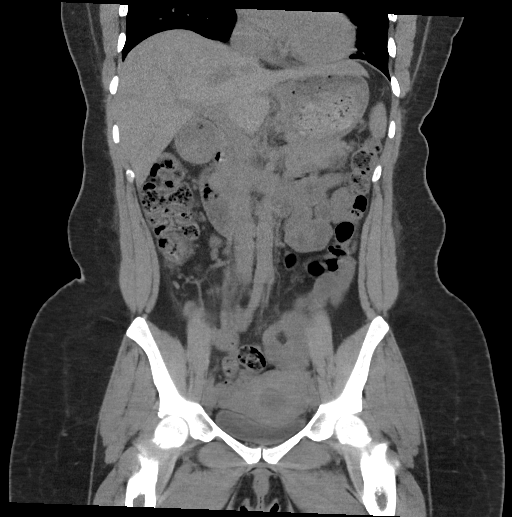
[im 50/90  soft-tissue]
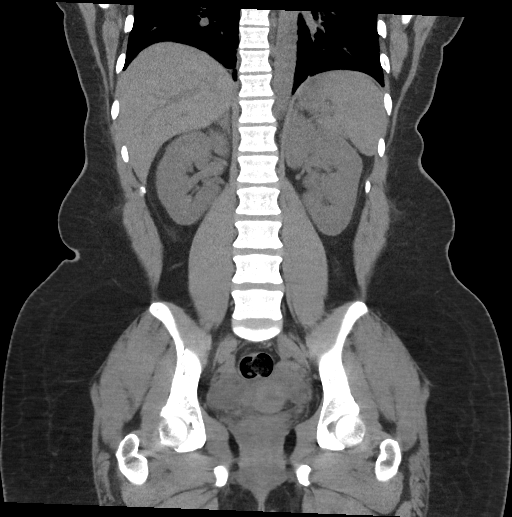

[17 of 46 positions shown; findings below may reference images not displayed]

FINDINGS: Lower chest: No acute abnormality.

Hepatobiliary: No focal liver abnormality is seen. No gallstones,
gallbladder wall thickening, or biliary dilatation.

Pancreas: Unremarkable. No pancreatic ductal dilatation or
surrounding inflammatory changes.

Spleen: Normal in size without focal abnormality.

Adrenals/Urinary Tract: Adrenal glands are unremarkable. Kidneys are
normal, without renal calculi, focal lesion, or hydronephrosis.
Bladder is unremarkable.

Stomach/Bowel: Stomach is within normal limits. Appendix appears
normal. No evidence of bowel wall thickening, distention, or
inflammatory changes.

Vascular/Lymphatic: No significant vascular findings are present. No
enlarged abdominal or pelvic lymph nodes.

Reproductive: Thickened endometrial stripe measuring up to 22 mm. No
adnexal mass.

Other: Negative for pelvic effusion or free air

Musculoskeletal: No acute or significant osseous findings.
IMPRESSION: 1. No CT evidence for acute intra-abdominal or pelvic abnormality.
2. Thickened endometrial stripe, see same day pelvic ultrasound for
additional findings.

## 2024-07-14 ENCOUNTER — Other Ambulatory Visit: Payer: Self-pay

## 2024-07-14 ENCOUNTER — Emergency Department
Admission: EM | Admit: 2024-07-14 | Discharge: 2024-07-14 | Disposition: A | Discharge status: Home / Self Care | Payer: Self-pay | Attending: Emergency Medicine | Admitting: Emergency Medicine

## 2024-07-14 ENCOUNTER — Emergency Department: Payer: Self-pay

## 2024-07-14 DIAGNOSIS — M5412 Radiculopathy, cervical region: Secondary | ICD-10-CM | POA: Insufficient documentation

## 2024-07-14 MED ORDER — MELOXICAM 15 MG PO TABS: 15.0000 mg | ORAL_TABLET | Freq: Every day | ORAL | 0 refills | Status: AC

## 2024-07-14 MED ORDER — KETOROLAC TROMETHAMINE 15 MG/ML IJ SOLN
15.0000 mg | Freq: Once | INTRAMUSCULAR | Status: AC
  Administered 2024-07-14: 15 mg via INTRAMUSCULAR
  Filled 2024-07-14: qty 1

## 2024-07-14 NOTE — ED Notes (Signed)
 See triage note  Presents with pain to right shoulder  States pain started 4 days ago w/o injury  No deformity noted

## 2024-07-14 NOTE — Discharge Instructions (Addendum)
 The x-ray of your shoulder did not show any acute abnormalities.  I believe the problem is coming from a pinched nerve in your neck.  This is best treated with anti-inflammatory medication as well as ice and heat.  Please follow-up with the orthopedic provider whose information is attached.  Return to the emergency department with any worsening symptoms like loss of sensation in your hand, inability to hold things in your hand, development of a fever, loss of bladder or bowel control or any other symptom personally concerning to you.  You can take 650 mg of Tylenol  every 6 hours as needed for pain. You can use ice, heat, muscle creams and other topical pain relievers as well.  Please take the meloxicam  (Mobic ) once a day for 2 weeks.  This is an anti-inflammatory.  Do not take other NSAIDs while taking this medication.  NSAIDs include ibuprofen , Motrin , Advil , naproxen, Aleve, celecoxib, and Celebrex.

## 2024-07-14 NOTE — ED Provider Notes (Signed)
 "  [facility] Provider Note    Event Date/Time   First MD Initiated Contact with Patient 07/14/24 1314     (approximate)   History   Shoulder Pain (Right/)   HPI  Kristina Welch is a 33 y.o. female with PMH of IDA and hidradenitis presents for evaluation of right shoulder pain ongoing for 4 days.  Patient denies any specific injury, fall or trauma.  She reports it hurts when trying to sleep and laying on that side.  She has had difficulty lifting things.  She reports the pain starts in her shoulder and radiates down her arm.  She has some tingling sensation in her 4th and 5th fingers.  She has tried massaging it and using a heating pad.  She denies loss of sensation and weakness.      Physical Exam   Triage Vital Signs: ED Triage Vitals  Encounter Vitals Group     BP 07/14/24 1248 134/82     Girls Systolic BP Percentile --      Girls Diastolic BP Percentile --      Boys Systolic BP Percentile --      Boys Diastolic BP Percentile --      Pulse Rate 07/14/24 1246 90     Resp 07/14/24 1246 20     Temp 07/14/24 1246 98 F (36.7 C)     Temp src --      SpO2 07/14/24 1246 98 %     Weight 07/14/24 1329 206 lb 12.7 oz (93.8 kg)     Height 07/14/24 1329 5' 1 (1.549 m)     Head Circumference --      Peak Flow --      Pain Score 07/14/24 1246 6     Pain Loc --      Pain Education --      Exclude from Growth Chart --     Most recent vital signs: Vitals:   07/14/24 1246 07/14/24 1248  BP:  134/82  Pulse: 90   Resp: 20   Temp: 98 F (36.7 C)   SpO2: 98%    General: Awake, no distress.  CV:  Good peripheral perfusion.  Resp:  Normal effort.  Abd:  No distention.  Other:  Radial pulse 2+ and regular, sensation intact across the hand, grip strength is equal bilaterally, arm strength is equal bilaterally and range of motion is maintained although does elicit some pain, negative empty can test, patient can reach across top of that shoulder  and can perform liftoff test on her back.  No tenderness to palpation over the neck but does have some mild tenderness over the trap.   ED Results / Procedures / Treatments   Labs (all labs ordered are listed, but only abnormal results are displayed) Labs Reviewed - No data to display   RADIOLOGY  Right shoulder x-ray obtained, interpreted the images as well as reviewed the radiologist report which is negative for acute abnormalities.  PROCEDURES:  Critical Care performed: No  Procedures   MEDICATIONS ORDERED IN ED: Medications  ketorolac  (TORADOL ) 15 MG/ML injection 15 mg (has no administration in time range)     IMPRESSION / MDM / ASSESSMENT AND PLAN / ED COURSE  I reviewed the triage vital signs and the nursing notes.                             33 year old female presents for evaluation of  right shoulder pain.  Vital signs are stable patient NAD on exam.  Differential diagnosis includes, but is not limited to, muscle strain, cervical radiculopathy, arthritis, fracture, dislocation, rotator cuff injury.  Patient's presentation is most consistent with acute complicated illness / injury requiring diagnostic workup.  X-ray of the shoulder is negative for acute abnormalities.  Given patient's description of her symptoms with radiation and tingling down in her fingers, I feel this is consistent with a cervical radiculopathy.  Recommended that she follow-up with an orthopedic provider whose information I will attach.  Discussed treating with anti-inflammatories and patient would prefer to use meloxicam .  She can take Tylenol  and use topical pain relievers as needed.  Patient did not need a note for work.  She voiced understanding, all questions were answered and she was stable at discharge.     FINAL CLINICAL IMPRESSION(S) / ED DIAGNOSES   Final diagnoses:  Cervical radiculopathy     Rx / DC Orders   ED Discharge Orders          Ordered    meloxicam  (MOBIC ) 15 MG  tablet  Daily        07/14/24 1518             Note:  This document was prepared using Dragon voice recognition software and may include unintentional dictation errors.   Cleaster Tinnie LABOR, PA-C 07/14/24 1520    Arlander Charleston, MD 07/14/24 1528  "

## 2024-07-14 NOTE — ED Triage Notes (Signed)
 Pt to ED via POV from home. Pt reports right shoulder pain x4 days. Pt reports hurts when trying to sleep and work on laptop for work. No injury. Hx of shoulder dislocation.
